# Patient Record
Sex: Male | Born: 1945 | Race: White | Hispanic: No | Marital: Single | Smoking: Current every day smoker
Health system: Southern US, Community
[De-identification: ages and names within clinical notes are randomized; demographics above are authoritative.]

## PROBLEM LIST (undated history)

## (undated) DIAGNOSIS — L989 Disorder of the skin and subcutaneous tissue, unspecified: Secondary | ICD-10-CM

## (undated) DIAGNOSIS — H3530 Unspecified macular degeneration: Secondary | ICD-10-CM

## (undated) DIAGNOSIS — C61 Malignant neoplasm of prostate: Secondary | ICD-10-CM

## (undated) DIAGNOSIS — E785 Hyperlipidemia, unspecified: Secondary | ICD-10-CM

## (undated) DIAGNOSIS — K219 Gastro-esophageal reflux disease without esophagitis: Secondary | ICD-10-CM

## (undated) DIAGNOSIS — I10 Essential (primary) hypertension: Secondary | ICD-10-CM

## (undated) DIAGNOSIS — D126 Benign neoplasm of colon, unspecified: Secondary | ICD-10-CM

## (undated) HISTORY — DX: Hyperlipidemia, unspecified: E78.5

## (undated) HISTORY — DX: Essential (primary) hypertension: I10

## (undated) HISTORY — PX: POLYPECTOMY: SHX149

## (undated) HISTORY — DX: Disorder of the skin and subcutaneous tissue, unspecified: L98.9

## (undated) HISTORY — DX: Gastro-esophageal reflux disease without esophagitis: K21.9

## (undated) HISTORY — PX: NECK LESION BIOPSY: SHX2078

## (undated) HISTORY — DX: Benign neoplasm of colon, unspecified: D12.6

## (undated) HISTORY — PX: COLONOSCOPY: SHX174

---

## 2003-05-18 ENCOUNTER — Encounter (INDEPENDENT_AMBULATORY_CARE_PROVIDER_SITE_OTHER): Payer: Self-pay | Admitting: *Deleted

## 2003-05-18 ENCOUNTER — Ambulatory Visit (HOSPITAL_COMMUNITY): Admission: RE | Admit: 2003-05-18 | Discharge: 2003-05-18 | Payer: Self-pay | Admitting: *Deleted

## 2003-05-18 ENCOUNTER — Encounter (INDEPENDENT_AMBULATORY_CARE_PROVIDER_SITE_OTHER): Payer: Self-pay | Admitting: Specialist

## 2005-03-05 ENCOUNTER — Encounter (INDEPENDENT_AMBULATORY_CARE_PROVIDER_SITE_OTHER): Payer: Self-pay | Admitting: Specialist

## 2005-03-05 ENCOUNTER — Ambulatory Visit (HOSPITAL_COMMUNITY): Admission: RE | Admit: 2005-03-05 | Discharge: 2005-03-05 | Payer: Self-pay | Admitting: *Deleted

## 2005-03-05 ENCOUNTER — Encounter (INDEPENDENT_AMBULATORY_CARE_PROVIDER_SITE_OTHER): Payer: Self-pay | Admitting: *Deleted

## 2008-12-29 ENCOUNTER — Ambulatory Visit: Payer: Self-pay | Admitting: Gastroenterology

## 2008-12-29 DIAGNOSIS — Z860100 Personal history of colon polyps, unspecified: Secondary | ICD-10-CM | POA: Insufficient documentation

## 2008-12-29 DIAGNOSIS — K219 Gastro-esophageal reflux disease without esophagitis: Secondary | ICD-10-CM

## 2009-01-15 DIAGNOSIS — D126 Benign neoplasm of colon, unspecified: Secondary | ICD-10-CM

## 2009-01-15 HISTORY — DX: Benign neoplasm of colon, unspecified: D12.6

## 2009-02-05 ENCOUNTER — Ambulatory Visit: Payer: Self-pay | Admitting: Gastroenterology

## 2009-02-13 ENCOUNTER — Encounter: Payer: Self-pay | Admitting: Gastroenterology

## 2009-11-20 ENCOUNTER — Encounter: Payer: Self-pay | Admitting: Gastroenterology

## 2009-11-29 ENCOUNTER — Encounter (INDEPENDENT_AMBULATORY_CARE_PROVIDER_SITE_OTHER): Payer: Self-pay | Admitting: *Deleted

## 2010-03-19 ENCOUNTER — Encounter (INDEPENDENT_AMBULATORY_CARE_PROVIDER_SITE_OTHER): Payer: Self-pay | Admitting: *Deleted

## 2010-03-28 ENCOUNTER — Encounter (INDEPENDENT_AMBULATORY_CARE_PROVIDER_SITE_OTHER): Payer: Self-pay | Admitting: *Deleted

## 2010-03-29 ENCOUNTER — Ambulatory Visit
Admission: RE | Admit: 2010-03-29 | Discharge: 2010-03-29 | Payer: Self-pay | Source: Home / Self Care | Attending: Gastroenterology | Admitting: Gastroenterology

## 2010-04-01 ENCOUNTER — Telehealth (INDEPENDENT_AMBULATORY_CARE_PROVIDER_SITE_OTHER): Payer: Self-pay | Admitting: *Deleted

## 2010-04-12 ENCOUNTER — Other Ambulatory Visit: Payer: Self-pay | Admitting: Gastroenterology

## 2010-04-12 ENCOUNTER — Ambulatory Visit
Admission: RE | Admit: 2010-04-12 | Discharge: 2010-04-12 | Payer: Self-pay | Source: Home / Self Care | Attending: Gastroenterology | Admitting: Gastroenterology

## 2010-04-12 DIAGNOSIS — D126 Benign neoplasm of colon, unspecified: Secondary | ICD-10-CM

## 2010-04-16 NOTE — Letter (Signed)
Summary: Colonoscopy Letter  [facility]  [address] [place], [place] [address]   Phone: [phone]  Fax: [phone]      November 29, 2009 MRN: [id number]   Dominic Kelly [address] [place], [place]  [address]   Dear [name],   According to your medical record, it is time for you to schedule a Colonoscopy. The American Cancer Society recommends this procedure as a method to detect early colon cancer. Patients with a family history of colon cancer, or a personal history of colon polyps or inflammatory bowel disease are at increased risk.  This letter has beeen generated based on the recommendations made at the time of your procedure. If you feel that in your particular situation this may no longer apply, please contact our office.  Please call our office at [phone] to schedule this appointment or to update your records at your earliest convenience.  Thank you for cooperating with [place] to provide you with the very best care possible.   Sincerely,  Judie Petit T. Russella Dar, M.D.  [facility] [phone]

## 2010-04-16 NOTE — Procedures (Signed)
Summary: Recall Assessment/[facility]  Recall Assessment/[facility]   Imported By: Sherian Rein 12/03/2009 07:32:24  _____________________________________________________________________  External Attachment:    Type:   Image     Comment:   External Document

## 2010-04-18 ENCOUNTER — Encounter: Payer: Self-pay | Admitting: Gastroenterology

## 2010-04-18 NOTE — Miscellaneous (Signed)
Summary: previsit prep/rm  Clinical Lists Changes  Medications: Added new medication of MOVIPREP 100 GM  SOLR (PEG-KCL-NACL-NASULF-NA ASC-C) As per prep instructions. - Signed Rx of MOVIPREP 100 GM  SOLR (PEG-KCL-NACL-NASULF-NA ASC-C) As per prep instructions.;  #1 x 0;  Signed;  Entered by: Sherren Kerns RN;  Authorized by: Meryl Dare MD [facility];  Method used: [facility] to [facility]  [phone]*, [address] [place], [place]  [address], Ph: [phone] or [phone], Fax: [phone] Observations: Added new observation of ALLERGY REV: Done (03/29/2010 9:46) Added new observation of NKA: T (03/29/2010 9:46)    Prescriptions: MOVIPREP 100 GM  SOLR (PEG-KCL-NACL-NASULF-NA ASC-C) As per prep instructions.  #1 x 0   Entered by:   Sherren Kerns RN   Authorized by:   Meryl Dare MD [place]   Signed by:   Sherren Kerns RN on 03/29/2010   Method used:   Electronically to        [facility]  [phone]* (retail)       [address] [place], [place]  [address]       Ph: [phone] or [phone]       Fax: [phone]   RxID:   [id number]

## 2010-04-18 NOTE — Progress Notes (Signed)
Summary: ins wont cover prep  Phone Note Call from Patient Call back at Home Phone [phone]   Caller: Patient Call For: Dr Russella Dar Reason for Call: Talk to Nurse Summary of Call: Insurance wont cover movi prep needs something else called in. Initial call taken by: Tawni Levy,  April 01, 2010 2:16 PM  Follow-up for Phone Call        pt offered rebate for Moviprep and agrees to use Moviprep. Follow-up by: Ezra Sites RN,  April 01, 2010 2:39 PM

## 2010-04-18 NOTE — Letter (Signed)
Summary: [facility] Instructions  [facility]  [address] [place], [place] [address]   Phone: [phone]  Fax: [phone]       Dominic Kelly    10/17/1945    MRN: [id number]        Procedure Day /Date:  Friday 04/12/2010     Arrival Time: 2:00 pm     Procedure Time: 3:00 pm     Location of Procedure:                    _ x_  [facility] (4th Floor)                        PREPARATION FOR COLONOSCOPY WITH MOVIPREP   Starting 5 days prior to your procedure Sunday 1/22 do not eat nuts, seeds, popcorn, corn, beans, peas,  salads, or any raw vegetables.  Do not take any fiber supplements (e.g. Metamucil, Citrucel, and Benefiber).  THE DAY BEFORE YOUR PROCEDURE         DATE: Thursday 1/26  1.  Drink clear liquids the entire day-NO SOLID FOOD  2.  Do not drink anything colored red or purple.  Avoid juices with pulp.  No orange juice.  3.  Drink at least 64 oz. (8 glasses) of fluid/clear liquids during the day to prevent dehydration and help the prep work efficiently.  CLEAR LIQUIDS INCLUDE: Water Jello Ice Popsicles Tea (sugar ok, no milk/cream) Powdered fruit flavored drinks Coffee (sugar ok, no milk/cream) Gatorade Juice: apple, white grape, white cranberry  Lemonade Clear bullion, consomm, broth Carbonated beverages (any kind) Strained chicken noodle soup Hard Candy                             4.  In the morning, mix first dose of MoviPrep solution:    Empty 1 Pouch A and 1 Pouch B into the disposable container    Add lukewarm drinking water to the top line of the container. Mix to dissolve    Refrigerate (mixed solution should be used within 24 hrs)  5.  Begin drinking the prep at 5:00 p.m. The MoviPrep container is divided by 4 marks.   Every 15 minutes drink the solution down to the next mark (approximately 8 oz) until the full liter is complete.   6.  Follow completed prep with 16 oz of clear liquid of your choice (Nothing  red or purple).  Continue to drink clear liquids until bedtime.  7.  Before going to bed, mix second dose of MoviPrep solution:    Empty 1 Pouch A and 1 Pouch B into the disposable container    Add lukewarm drinking water to the top line of the container. Mix to dissolve    Refrigerate  THE DAY OF YOUR PROCEDURE      DATE: Friday 1/27  Beginning at 10:00 a.m. (5 hours before procedure):         1. Every 15 minutes, drink the solution down to the next mark (approx 8 oz) until the full liter is complete.  2. Follow completed prep with 16 oz. of clear liquid of your choice.    3. You may drink clear liquids until 1:00 pm (2 HOURS BEFORE PROCEDURE).   MEDICATION INSTRUCTIONS  Unless otherwise instructed, you should take regular prescription medications with a small sip of water   as early as possible the morning of your  procedure.        OTHER INSTRUCTIONS  You will need a responsible adult at least 67 years of age to accompany you and drive you home.   This person must remain in the waiting room during your procedure.  Wear loose fitting clothing that is easily removed.  Leave jewelry and other valuables at home.  However, you may wish to bring a book to read or  an iPod/MP3 player to listen to music as you wait for your procedure to start.  Remove all body piercing jewelry and leave at home.  Total time from sign-in until discharge is approximately 2-3 hours.  You should go home directly after your procedure and rest.  You can resume normal activities the  day after your procedure.  The day of your procedure you should not:   Drive   Make legal decisions   Operate machinery   Drink alcohol   Return to work  You will receive specific instructions about eating, activities and medications before you leave.    The above instructions have been reviewed and explained to me by   Sherren Kerns RN  March 29, 2010 10:18 AM   I fully understand and can verbalize  these instructions _____________________________ Date _________

## 2010-04-18 NOTE — Letter (Signed)
Summary: Pre Visit Letter Revised  [facility]  [address] [place], [place] [address]   Phone: [phone]  Fax: [phone]        03/19/2010 MRN: [id number] Dominic Kelly [address] [place], [place]  [address]             Procedure Date:  04-12-10   Welcome to the Gastroenterology Division at [facility].    You are scheduled to see a nurse for your pre-procedure visit on 03-29-10 at 10:30a.m. on the 3rd floor at [facility], [address]  We ask that you try to arrive at our office 15 minutes prior to your appointment time to allow for check-in.  Please take a minute to review the attached form.  If you answer "Yes" to one or more of the questions on the first page, we ask that you call the person listed at your earliest opportunity.  If you answer "No" to all of the questions, please complete the rest of the form and bring it to your appointment.    Your nurse visit will consist of discussing your medical and surgical history, your immediate family medical history, and your medications.   If you are unable to list all of your medications on the form, please bring the medication bottles to your appointment and we will list them.  We will need to be aware of both prescribed and over the counter drugs.  We will need to know exact dosage information as well.    Please be prepared to read and sign documents such as consent forms, a financial agreement, and acknowledgement forms.  If necessary, and with your consent, a friend or relative is welcome to sit-in on the nurse visit with you.  Please bring your insurance card so that we may make a copy of it.  If your insurance requires a referral to see a specialist, please bring your referral form from your primary care physician.  No co-pay is required for this nurse visit.     If you cannot keep your appointment, please call [phone] to cancel or reschedule prior to your appointment date.  This  allows [place] the opportunity to schedule an appointment for another patient in need of care.    Thank you for choosing [facility] for your medical needs.  We appreciate the opportunity to care for you.  Please visit [place] at our website  to learn more about our practice.  Sincerely, The Gastroenterology Division

## 2010-04-18 NOTE — Procedures (Addendum)
Summary: Colonoscopy  Patient: Dominic Kelly Note: All result statuses are Final unless otherwise noted.  Tests: (1) Colonoscopy (COL)   COL Colonoscopy           DONE (C)     [facility]     [address]     [place], [place]  [address]           COLONOSCOPY PROCEDURE REPORT           PATIENT:  Dominic Kelly, Dominic Kelly  MR#:  [id number]     BIRTHDATE:  [birth year], 64 yrs. old  GENDER:  male     ENDOSCOPIST:  Judie Petit T. Russella Dar, MD, [facility]           PROCEDURE DATE:  04/12/2010     PROCEDURE:  Colonoscopy with snare polypectomy, and hot biopsy     polypectomy     ASA CLASS:  Class II     INDICATIONS:  1) surveillance and high-risk screening  2) history     of adenomatous colon polyps, 2010, piecemeal polypectomy.     MEDICATIONS:   Fentanyl 75 mcg IV, Versed 7 mg IV     DESCRIPTION OF PROCEDURE:   After the risks benefits and     alternatives of the procedure were thoroughly explained, informed     consent was obtained.  Digital rectal exam was performed and     revealed no abnormalities.   The LB PCF-[id number] endoscope     was introduced through the anus and advanced to the cecum, which     was identified by both the appendix and ileocecal valve, without     limitations.  The quality of the prep was excellent, using     MoviPrep.  The instrument was then slowly withdrawn as the colon     was fully examined.     <<PROCEDUREIMAGES>>     FINDINGS:  A sessile polyp was found in the ascending colon. It     was 10 mm in size. Polyp was snared, then cauterized with     monopolar cautery. Retrieval was successful. Piecemeal polypectomy     that appeared to be complete wtih polyp best seen on retroflexed     view. A sessile polyp was found in the proximal transverse colon.     It was 10 mm in size. With hot biopsy forceps, the polyp was     cauterized in several places biopsies were obtained and sent to     pathology. Piecemeal polypectomy of polyp located behind a fold.     Not  certain the entire polyp was removed/destroyed. Mild     diverticulosis was found in the sigmoid colon. A normal appearing     cecum, ileocecal valve, and appendiceal orifice were identified.     The hepatic flexure, splenic flexure, descending colon, and rectum     appeared unremarkable. Retroflexed views in the rectum revealed     internal hemorrhoids, small. The time to cecum =  2  minutes. The     scope was then withdrawn (time =  18.75  min) from the patient and     the procedure completed.           COMPLICATIONS:  None           ENDOSCOPIC IMPRESSION:     1) 10 mm sessile polyp in the ascending colon     2) 10 mm sessile polyp in the proximal transverse colon     3) Mild  diverticulosis in the sigmoid colon     4) Internal hemorrhoids           RECOMMENDATIONS:     1) Hold aspirin, aspirin products, and anti-inflamatory     medication for 2 weeks.     2) Await pathology results     3) High fiber diet with liberal fluid intake.     4) Repeat Colonoscopy in 1 year if transverse colon polyp     adenomatous, 2 years otherwise pending pathology review           Malcolm T. Russella Dar, MD, [facility]           CC:  Pearson Grippe, MD           n.     REVISED:  04/12/2010 03:22 PM     eSIGNED:   Judie Petit T. Stark at 04/12/2010 03:22 PM           Dominic Kelly, [id number]  Note: An exclamation mark (!) indicates a result that was not dispersed into the flowsheet. Document Creation Date: 04/12/2010 3:22 PM _______________________________________________________________________  (1) Order result status: Final Collection or observation date-time: 04/12/2010 15:12 Requested date-time:  Receipt date-time:  Reported date-time:  Referring Physician:   Ordering Physician: Claudette Head [phone]) Specimen Source:  Source: Launa Grill Order Number: [id number] Lab site:   Appended Document: Colonoscopy     Procedures Next Due Date:    Colonoscopy: 03/2011

## 2010-04-24 NOTE — Letter (Addendum)
Summary: Patient Notice- Polyp Results  [facility]  [address] [place], [place] [address]   Phone: [phone]  Fax: [phone]        April 18, 2010 MRN: [id number]    Dominic Kelly [address] [place], [place]  [address]    Dear Mr. Cupps,  I am pleased to inform you that the colon polyp(s) removed during your recent colonoscopy was (were) found to be benign (no cancer detected) upon pathologic examination.  I recommend you have a repeat colonoscopy examination in 1 year to look for recurrent polyps, as having colon polyps increases your risk for having recurrent polyps or even colon cancer in the future.  Should you develop new or worsening symptoms of abdominal pain, bowel habit changes or bleeding from the rectum or bowels, please schedule an evaluation with either your primary care physician or with me.  Continue treatment plan as outlined the day of your exam.  Please call us if you are having persistent problems or have questions about your condition that have not been fully answered at this time.  Sincerely,  Meryl Dare MD [facility]  This letter has been electronically signed by your physician.  Appended Document: Patient Notice- Polyp Results LETTER MAILED

## 2010-08-02 NOTE — Op Note (Signed)
NAMEDAUNE, COLGATE NO.:  [id number]   MEDICAL RECORD NO.:  [id number]          PATIENT TYPE:  AMB   LOCATION:  ENDO                         FACILITY[facility]  [facility]   PHYSICIAN:  Georgiana Spinner, M.D.    DATE OF BIRTH:  Jun 04, 1945   DATE OF PROCEDURE:  03/05/2005  DATE OF DISCHARGE:                                 OPERATIVE REPORT   PROCEDURE:  Upper endoscopy.   INDICATIONS:  Rule out Barrett's esophagus.   ANESTHESIA:  Demerol 50, Versed 5 mg.   DESCRIPTION OF PROCEDURE:  With the patient mildly sedated in the left  lateral decubitus position,  the Olympus videoscopic endoscope was inserted  in the mouth, passed under direct vision through the esophagus which  appeared normal. I could not see any clear-cut Barrett's at this point but  we photographed and biopsied areas that could be potentially Barrett's.  Subsequently we advanced into the stomach. The fundus, body, antrum,  duodenal bulb, and second portion of duodenum were visualized. From this  point, the endoscope was slowly withdrawn taking circumferential views of  the duodenal mucosa until the endoscope had been pulled back into the  stomach, placed in retroflexion to view the stomach from below. The  endoscope was straightened and withdrawn taking circumferential views of the  remaining gastric and esophageal mucosa. The patient's vital signs, and  pulse oximeter remained stable. The patient tolerated the procedure well  without apparent complications.   FINDINGS:  Await biopsy report. The patient will call me for results and  follow-up with me as an outpatient.           ______________________________  Georgiana Spinner, M.D.     GMO/MEDQ  D:  03/05/2005  T:  03/06/2005  Job:  [id number]

## 2010-08-02 NOTE — Op Note (Signed)
NAME:  [name], Dominic Kelly                       ACCOUNT NO.:  [id number]   MEDICAL RECORD NO.:  [id number]                   PATIENT TYPE:  AMB   LOCATION:  ENDO                                 FACILITY:  [facility]   PHYSICIAN:  Georgiana Spinner, M.D.                 DATE OF BIRTH:  09/30/1945   DATE OF PROCEDURE:  05/18/2003  DATE OF DISCHARGE:                                 OPERATIVE REPORT   PROCEDURE PERFORMED:  Colonoscopy.   ENDOSCOPIST:  Georgiana Spinner, M.D.   INDICATIONS FOR PROCEDURE:  Colon cancer screening.   ANESTHESIA:  Demerol 10 mg.  Versed 1 mg.   DESCRIPTION OF PROCEDURE:  With the patient mildly sedated in the left  lateral decubitus position, the Olympus video colonoscope was inserted in  the rectum and passed under direct vision to the cecum, identified by the  ileocecal valve and appendiceal orifice, both of which were photographed.  From this point the colonoscope was slowly withdrawn taking circumferential  views of the entire colonic mucosa stopping only in the rectum which  appeared normal on direct and retroflex view.  The endoscope was  straightened and withdrawn.  The patient's vital signs and pulse oximeter  remained stable.  The patient tolerated the procedure well without apparent  complications.   FINDINGS:  Unremarkable examination.   PLAN:  See endoscopy note for further details.                                               Georgiana Spinner, M.D.    GMO/MEDQ  D:  05/18/2003  T:  05/18/2003  Job:  [id number]

## 2010-08-02 NOTE — Op Note (Signed)
NAME:  Dominic Kelly, Dominic Kelly                       ACCOUNT NO.:  [id number]   MEDICAL RECORD NO.:  [id number]                   PATIENT TYPE:  AMB   LOCATION:  ENDO                                 FACILITY:  [facility]   PHYSICIAN:  Georgiana Spinner, M.D.                 DATE OF BIRTH:  25-Dec-1945   DATE OF PROCEDURE:  05/18/2003  DATE OF DISCHARGE:                                 OPERATIVE REPORT   PROCEDURE PERFORMED:  Upper endoscopy.   ENDOSCOPIST:  Georgiana Spinner, M.D.   INDICATIONS FOR PROCEDURE:  Gastroesophageal reflux disease.   ANESTHESIA:  Demerol 40 mg, Versed 4 mg.   DESCRIPTION OF PROCEDURE:  With the patient mildly sedated in the left  lateral decubitus position, the Olympus video endoscope was inserted in the  mouth and passed under direct vision through the esophagus which appeared  normal until we reached the distal esophagus and there was a question of  short segment Barrett's photographed and biopsied.  We entered into the  stomach.  The fundus, body, antrum were visualized and in the antrum were  diffuse punctate erythematous changes consistent with small multiple  erosions which were photographed and biopsied.  The duodenal bulb appeared  to show some mild erythema.  Second portion of the duodenum, however, was  normal.  From this point, the endoscope was slowly withdrawn taking  circumferential views of the duodenal mucosa until the endoscope was pulled  back into the stomach and placed on retroflexion to view the stomach from  below.  The endoscope was then straightened and withdrawn taking  circumferential views of the remaining gastric and esophageal mucosa.  The  patient's vital signs and pulse oximeter remained stable.  The patient  tolerated the procedure well without apparent complications.   FINDINGS:  Question of Barrett's esophagus biopsied, antral erosions  biopsied.  Await biopsy reports.  Patient will call me for results and  follow up with me as an  outpatient and proceed to colonoscopy.   PLAN:                                               Georgiana Spinner, M.D.    GMO/MEDQ  D:  05/18/2003  T:  05/18/2003  Job:  [id number]

## 2010-08-19 ENCOUNTER — Other Ambulatory Visit: Payer: Self-pay | Admitting: Ophthalmology

## 2011-04-23 ENCOUNTER — Encounter: Payer: Self-pay | Admitting: Gastroenterology

## 2011-05-07 DIAGNOSIS — L570 Actinic keratosis: Secondary | ICD-10-CM | POA: Diagnosis not present

## 2011-05-07 DIAGNOSIS — D235 Other benign neoplasm of skin of trunk: Secondary | ICD-10-CM | POA: Diagnosis not present

## 2011-05-19 ENCOUNTER — Encounter: Payer: Self-pay | Admitting: Gastroenterology

## 2011-05-19 ENCOUNTER — Ambulatory Visit (INDEPENDENT_AMBULATORY_CARE_PROVIDER_SITE_OTHER): Payer: Medicare Other | Admitting: Gastroenterology

## 2011-05-19 VITALS — BP 140/90 | HR 68 | Ht 70.0 in | Wt 207.2 lb

## 2011-05-19 DIAGNOSIS — Z86010 Personal history of colonic polyps: Secondary | ICD-10-CM

## 2011-05-19 DIAGNOSIS — K219 Gastro-esophageal reflux disease without esophagitis: Secondary | ICD-10-CM | POA: Diagnosis not present

## 2011-05-19 MED ORDER — PEG-KCL-NACL-NASULF-NA ASC-C 100 G PO SOLR: 1.0000 | Freq: Once | ORAL | Status: DC

## 2011-05-19 NOTE — Patient Instructions (Addendum)
You have been given a separate informational sheet regarding your tobacco use, the importance of quitting and local resources to help you quit.  You have been scheduled for a colonoscopy with propofol. Please follow written instructions given to you at your visit today.  Please pick up your prep kit at the pharmacy within the next 1-3 days.  cc: Pearson Grippe, MD

## 2011-05-19 NOTE — Progress Notes (Signed)
History of Present Illness: This is a 66 year old male with a history of adenomatous colon polyps. He underwent piecemeal polypectomy of polyp located behind a fold in January 2012. It was unclear if the polyp was completely removed. Prior to that he had piecemeal polypectomy in 2010. He has no ongoing gastrointestinal complaints. He notes a recent cough. Denies weight loss, abdominal pain, constipation, diarrhea, change in stool caliber, melena, hematochezia, nausea, vomiting, dysphagia, reflux symptoms, chest pain.  Current Medications, Allergies, Past Medical History, Past Surgical History, Family History and Social History were reviewed in [employer] record.  Physical Exam: General: Well developed , well nourished, no acute distress Head: Normocephalic and atraumatic Eyes:  sclerae anicteric, EOMI Ears: Normal auditory acuity Mouth: No deformity or lesions Lungs: Scattered wheezes bilaterally Heart: Regular rate and rhythm; no murmurs, rubs or bruits Abdomen: Soft, non tender and non distended. No masses, hepatosplenomegaly or hernias noted. Normal Bowel sounds Musculoskeletal: Symmetrical with no gross deformities  Pulses:  Normal pulses noted Extremities: No clubbing, cyanosis, edema or deformities noted Neurological: Alert oriented x 4, grossly nonfocal Psychological:  Alert and cooperative. Normal mood and affect  Assessment and Recommendations:  1. Personal history of adenomatous colon polyps with piecemeal polypectomy performed in January 2012. For followup colonoscopy to determine the polyp was completely removed and to survey for other colon polyps. The risks, benefits, and alternatives to colonoscopy with possible biopsy and possible polypectomy were discussed with the patient and they consent to proceed.   2. Recent cough scattered wheezes on physical examination. Advised him to schedule an office evaluation with Dr. Selena Batten.   3. GERD. Currently diet  controlled.

## 2011-07-09 ENCOUNTER — Encounter: Payer: Self-pay | Admitting: Gastroenterology

## 2011-07-09 ENCOUNTER — Ambulatory Visit (AMBULATORY_SURGERY_CENTER): Payer: Medicare Other | Admitting: Gastroenterology

## 2011-07-09 VITALS — BP 148/79 | HR 66 | Temp 96.4°F | Resp 18 | Ht 70.0 in | Wt 207.0 lb

## 2011-07-09 DIAGNOSIS — Z1211 Encounter for screening for malignant neoplasm of colon: Secondary | ICD-10-CM

## 2011-07-09 DIAGNOSIS — I10 Essential (primary) hypertension: Secondary | ICD-10-CM | POA: Diagnosis not present

## 2011-07-09 DIAGNOSIS — Z86010 Personal history of colonic polyps: Secondary | ICD-10-CM | POA: Diagnosis not present

## 2011-07-09 DIAGNOSIS — K635 Polyp of colon: Secondary | ICD-10-CM

## 2011-07-09 DIAGNOSIS — D126 Benign neoplasm of colon, unspecified: Secondary | ICD-10-CM

## 2011-07-09 DIAGNOSIS — K219 Gastro-esophageal reflux disease without esophagitis: Secondary | ICD-10-CM | POA: Diagnosis not present

## 2011-07-09 MED ORDER — SODIUM CHLORIDE 0.9 % IV SOLN: 500.0000 mL | INTRAVENOUS | Status: DC

## 2011-07-09 NOTE — Progress Notes (Signed)
Patient did not experience any of the following events: a burn prior to discharge; a fall within the facility; wrong site/side/patient/procedure/implant event; or a hospital transfer or hospital admission upon discharge from the facility. (G8907) Patient did not have preoperative order for IV antibiotic SSI prophylaxis. (G8918)  

## 2011-07-09 NOTE — Op Note (Signed)
[facility] [address] [place], [place]  [address]  COLONOSCOPY PROCEDURE REPORT PATIENT:  Dominic Kelly, Dominic Kelly  MR#:  [id number] BIRTHDATE:  April 25, 1945, 65 yrs. old  GENDER:  male ENDOSCOPIST:  Judie Petit T. Russella Dar, MD, [facility]  PROCEDURE DATE:  07/09/2011 PROCEDURE:  Colonoscopy with biopsy ASA CLASS:  Class II INDICATIONS:  1) surveillance and high-risk screening  2) history of pre-cancerous (adenomatous) colon polyps MEDICATIONS:   MAC sedation, administered by CRNA, propofol (Diprivan) 200 mg IV DESCRIPTION OF PROCEDURE:   After the risks benefits and alternatives of the procedure were thoroughly explained, informed consent was obtained.  Digital rectal exam was performed and revealed no abnormalities.   The LB CF-H180AL [id number] endoscope was introduced through the anus and advanced to the cecum, which was identified by both the appendix and ileocecal valve, without limitations.  The quality of the prep was good, using MoviPrep. The instrument was then slowly withdrawn as the colon was fully examined. <<PROCEDUREIMAGES>> FINDINGS:  A sessile polyp was found at the hepatic flexure. It was 4 mm in size. The polyp was removed using cold biopsy forceps. Mild diverticulosis was found in the sigmoid colon.  Otherwise normal colonoscopy without other polyps, masses, vascular ectasias, or inflammatory changes. Retroflexed views in the rectum revealed internal hemorrhoids, small.  The time to cecum =  3.25 minutes. The scope was then withdrawn (time =  8.5  min) from the patient and the procedure completed.  COMPLICATIONS:  None  ENDOSCOPIC IMPRESSION: 1) 4 mm sessile polyp at the hepatic flexure 2) Mild diverticulosis in the sigmoid colon 3) Internal hemorrhoids  RECOMMENDATIONS: 1) Await pathology results 2) High fiber diet with liberal fluid intake. 3) Repeat Colonoscopy in 3 years.  Venita Lick. Russella Dar, MD, [facility]  CC:  Pearson Grippe, MD  n. Rosalie DoctorVenita Lick. Saleem Coccia at  07/09/2011 08:55 AM  Sinclair Ship, [id number]

## 2011-07-09 NOTE — Patient Instructions (Signed)
Discharge instructions given with verbal understanding. Handouts on polyps,diverticulosis, and hemorrhoids given. Resume previous medications.YOU HAD AN ENDOSCOPIC PROCEDURE TODAY AT THE [facility]: Refer to the procedure report that was given to you for any specific questions about what was found during the examination.  If the procedure report does not answer your questions, please call your gastroenterologist to clarify.  If you requested that your care partner not be given the details of your procedure findings, then the procedure report has been included in a sealed envelope for you to review at your convenience later.  YOU SHOULD EXPECT: Some feelings of bloating in the abdomen. Passage of more gas than usual.  Walking can help get rid of the air that was put into your GI tract during the procedure and reduce the bloating. If you had a lower endoscopy (such as a colonoscopy or flexible sigmoidoscopy) you may notice spotting of blood in your stool or on the toilet paper. If you underwent a bowel prep for your procedure, then you may not have a normal bowel movement for a few days.  DIET: Your first meal following the procedure should be a light meal and then it is ok to progress to your normal diet.  A half-sandwich or bowl of soup is an example of a good first meal.  Heavy or fried foods are harder to digest and may make you feel nauseous or bloated.  Likewise meals heavy in dairy and vegetables can cause extra gas to form and this can also increase the bloating.  Drink plenty of fluids but you should avoid alcoholic beverages for 24 hours.  ACTIVITY: Your care partner should take you home directly after the procedure.  You should plan to take it easy, moving slowly for the rest of the day.  You can resume normal activity the day after the procedure however you should NOT DRIVE or use heavy machinery for 24 hours (because of the sedation medicines used during the test).    SYMPTOMS TO  REPORT IMMEDIATELY: A gastroenterologist can be reached at any hour.  During normal business hours, 8:30 AM to 5:00 PM Monday through Friday, call [phone].  After hours and on weekends, please call the GI answering service at [phone] who will take a message and have the physician on call contact you.   Following lower endoscopy (colonoscopy or flexible sigmoidoscopy):  Excessive amounts of blood in the stool  Significant tenderness or worsening of abdominal pains  Swelling of the abdomen that is new, acute  Fever of 100F or higher FOLLOW UP: If any biopsies were taken you will be contacted by phone or by letter within the next 1-3 weeks.  Call your gastroenterologist if you have not heard about the biopsies in 3 weeks.  Our staff will call the home number listed on your records the next business day following your procedure to check on you and address any questions or concerns that you may have at that time regarding the information given to you following your procedure. This is a courtesy call and so if there is no answer at the home number and we have not heard from you through the emergency physician on call, we will assume that you have returned to your regular daily activities without incident.  SIGNATURES/CONFIDENTIALITY: You and/or your care partner have signed paperwork which will be entered into your electronic medical record.  These signatures attest to the fact that that the information above on your After Visit Summary has  been reviewed and is understood.  Full responsibility of the confidentiality of this discharge information lies with you and/or your care-partner.  

## 2011-07-10 ENCOUNTER — Telehealth: Payer: Self-pay

## 2011-07-10 NOTE — Telephone Encounter (Signed)
  Follow up Call-  Call back number 07/09/2011  Post procedure Call Back phone  # [phone]  Permission to leave phone message Yes     Patient questions:  Do you have a fever, pain , or abdominal swelling? no Pain Score  0 *  Have you tolerated food without any problems? yes  Have you been able to return to your normal activities? yes  Do you have any questions about your discharge instructions: Diet   no Medications  no Follow up visit  no  Do you have questions or concerns about your Care? no  Actions: * If pain score is 4 or above: No action needed, pain <4.   Per the pt "no problems", maw

## 2011-07-14 ENCOUNTER — Encounter: Payer: Self-pay | Admitting: Gastroenterology

## 2011-07-24 DIAGNOSIS — H40019 Open angle with borderline findings, low risk, unspecified eye: Secondary | ICD-10-CM | POA: Diagnosis not present

## 2011-07-24 DIAGNOSIS — H52209 Unspecified astigmatism, unspecified eye: Secondary | ICD-10-CM | POA: Diagnosis not present

## 2011-07-24 DIAGNOSIS — H2510 Age-related nuclear cataract, unspecified eye: Secondary | ICD-10-CM | POA: Diagnosis not present

## 2011-07-24 DIAGNOSIS — H524 Presbyopia: Secondary | ICD-10-CM | POA: Diagnosis not present

## 2011-08-06 DIAGNOSIS — L570 Actinic keratosis: Secondary | ICD-10-CM | POA: Diagnosis not present

## 2011-08-06 DIAGNOSIS — L259 Unspecified contact dermatitis, unspecified cause: Secondary | ICD-10-CM | POA: Diagnosis not present

## 2011-09-04 DIAGNOSIS — L820 Inflamed seborrheic keratosis: Secondary | ICD-10-CM | POA: Diagnosis not present

## 2011-09-04 DIAGNOSIS — L259 Unspecified contact dermatitis, unspecified cause: Secondary | ICD-10-CM | POA: Diagnosis not present

## 2011-09-09 DIAGNOSIS — Z125 Encounter for screening for malignant neoplasm of prostate: Secondary | ICD-10-CM | POA: Diagnosis not present

## 2011-09-09 DIAGNOSIS — E7800 Pure hypercholesterolemia, unspecified: Secondary | ICD-10-CM | POA: Diagnosis not present

## 2011-09-09 DIAGNOSIS — R7309 Other abnormal glucose: Secondary | ICD-10-CM | POA: Diagnosis not present

## 2011-09-12 DIAGNOSIS — I10 Essential (primary) hypertension: Secondary | ICD-10-CM | POA: Diagnosis not present

## 2011-09-12 DIAGNOSIS — E875 Hyperkalemia: Secondary | ICD-10-CM | POA: Diagnosis not present

## 2012-02-23 DIAGNOSIS — I10 Essential (primary) hypertension: Secondary | ICD-10-CM | POA: Diagnosis not present

## 2012-02-26 DIAGNOSIS — Z0000 Encounter for general adult medical examination without abnormal findings: Secondary | ICD-10-CM | POA: Diagnosis not present

## 2012-02-26 DIAGNOSIS — R7309 Other abnormal glucose: Secondary | ICD-10-CM | POA: Diagnosis not present

## 2012-02-26 DIAGNOSIS — I10 Essential (primary) hypertension: Secondary | ICD-10-CM | POA: Diagnosis not present

## 2012-02-26 DIAGNOSIS — Z23 Encounter for immunization: Secondary | ICD-10-CM | POA: Diagnosis not present

## 2012-02-26 DIAGNOSIS — E7800 Pure hypercholesterolemia, unspecified: Secondary | ICD-10-CM | POA: Diagnosis not present

## 2012-02-26 DIAGNOSIS — E875 Hyperkalemia: Secondary | ICD-10-CM | POA: Diagnosis not present

## 2012-02-27 DIAGNOSIS — L570 Actinic keratosis: Secondary | ICD-10-CM | POA: Diagnosis not present

## 2012-02-27 DIAGNOSIS — L259 Unspecified contact dermatitis, unspecified cause: Secondary | ICD-10-CM | POA: Diagnosis not present

## 2012-07-29 DIAGNOSIS — H524 Presbyopia: Secondary | ICD-10-CM | POA: Diagnosis not present

## 2012-07-29 DIAGNOSIS — H52209 Unspecified astigmatism, unspecified eye: Secondary | ICD-10-CM | POA: Diagnosis not present

## 2012-07-29 DIAGNOSIS — H2510 Age-related nuclear cataract, unspecified eye: Secondary | ICD-10-CM | POA: Diagnosis not present

## 2012-08-23 DIAGNOSIS — R7309 Other abnormal glucose: Secondary | ICD-10-CM | POA: Diagnosis not present

## 2012-08-23 DIAGNOSIS — I10 Essential (primary) hypertension: Secondary | ICD-10-CM | POA: Diagnosis not present

## 2012-08-23 DIAGNOSIS — E7800 Pure hypercholesterolemia, unspecified: Secondary | ICD-10-CM | POA: Diagnosis not present

## 2012-08-26 DIAGNOSIS — R21 Rash and other nonspecific skin eruption: Secondary | ICD-10-CM | POA: Diagnosis not present

## 2012-08-26 DIAGNOSIS — E7800 Pure hypercholesterolemia, unspecified: Secondary | ICD-10-CM | POA: Diagnosis not present

## 2012-08-26 DIAGNOSIS — I10 Essential (primary) hypertension: Secondary | ICD-10-CM | POA: Diagnosis not present

## 2012-08-26 DIAGNOSIS — R7309 Other abnormal glucose: Secondary | ICD-10-CM | POA: Diagnosis not present

## 2012-09-13 DIAGNOSIS — L259 Unspecified contact dermatitis, unspecified cause: Secondary | ICD-10-CM | POA: Diagnosis not present

## 2012-09-13 DIAGNOSIS — L821 Other seborrheic keratosis: Secondary | ICD-10-CM | POA: Diagnosis not present

## 2012-09-13 DIAGNOSIS — B079 Viral wart, unspecified: Secondary | ICD-10-CM | POA: Diagnosis not present

## 2012-09-13 DIAGNOSIS — L570 Actinic keratosis: Secondary | ICD-10-CM | POA: Diagnosis not present

## 2013-02-21 DIAGNOSIS — R7309 Other abnormal glucose: Secondary | ICD-10-CM | POA: Diagnosis not present

## 2013-02-21 DIAGNOSIS — I10 Essential (primary) hypertension: Secondary | ICD-10-CM | POA: Diagnosis not present

## 2013-02-21 DIAGNOSIS — Z125 Encounter for screening for malignant neoplasm of prostate: Secondary | ICD-10-CM | POA: Diagnosis not present

## 2013-02-24 DIAGNOSIS — Z23 Encounter for immunization: Secondary | ICD-10-CM | POA: Diagnosis not present

## 2013-02-24 DIAGNOSIS — I10 Essential (primary) hypertension: Secondary | ICD-10-CM | POA: Diagnosis not present

## 2013-02-24 DIAGNOSIS — R7309 Other abnormal glucose: Secondary | ICD-10-CM | POA: Diagnosis not present

## 2013-02-24 DIAGNOSIS — N400 Enlarged prostate without lower urinary tract symptoms: Secondary | ICD-10-CM | POA: Diagnosis not present

## 2013-02-24 DIAGNOSIS — E7800 Pure hypercholesterolemia, unspecified: Secondary | ICD-10-CM | POA: Diagnosis not present

## 2013-02-28 DIAGNOSIS — B079 Viral wart, unspecified: Secondary | ICD-10-CM | POA: Diagnosis not present

## 2013-02-28 DIAGNOSIS — L570 Actinic keratosis: Secondary | ICD-10-CM | POA: Diagnosis not present

## 2013-02-28 DIAGNOSIS — L259 Unspecified contact dermatitis, unspecified cause: Secondary | ICD-10-CM | POA: Diagnosis not present

## 2013-02-28 DIAGNOSIS — D235 Other benign neoplasm of skin of trunk: Secondary | ICD-10-CM | POA: Diagnosis not present

## 2013-08-04 DIAGNOSIS — H52209 Unspecified astigmatism, unspecified eye: Secondary | ICD-10-CM | POA: Diagnosis not present

## 2013-08-04 DIAGNOSIS — H2510 Age-related nuclear cataract, unspecified eye: Secondary | ICD-10-CM | POA: Diagnosis not present

## 2013-08-22 DIAGNOSIS — R7309 Other abnormal glucose: Secondary | ICD-10-CM | POA: Diagnosis not present

## 2013-08-22 DIAGNOSIS — I10 Essential (primary) hypertension: Secondary | ICD-10-CM | POA: Diagnosis not present

## 2013-08-22 DIAGNOSIS — N400 Enlarged prostate without lower urinary tract symptoms: Secondary | ICD-10-CM | POA: Diagnosis not present

## 2013-08-25 DIAGNOSIS — Z125 Encounter for screening for malignant neoplasm of prostate: Secondary | ICD-10-CM | POA: Diagnosis not present

## 2013-08-25 DIAGNOSIS — R7309 Other abnormal glucose: Secondary | ICD-10-CM | POA: Diagnosis not present

## 2013-08-25 DIAGNOSIS — I10 Essential (primary) hypertension: Secondary | ICD-10-CM | POA: Diagnosis not present

## 2013-08-25 DIAGNOSIS — E7800 Pure hypercholesterolemia, unspecified: Secondary | ICD-10-CM | POA: Diagnosis not present

## 2013-08-29 DIAGNOSIS — L821 Other seborrheic keratosis: Secondary | ICD-10-CM | POA: Diagnosis not present

## 2013-08-29 DIAGNOSIS — D235 Other benign neoplasm of skin of trunk: Secondary | ICD-10-CM | POA: Diagnosis not present

## 2013-08-29 DIAGNOSIS — D485 Neoplasm of uncertain behavior of skin: Secondary | ICD-10-CM | POA: Diagnosis not present

## 2013-08-29 DIAGNOSIS — L570 Actinic keratosis: Secondary | ICD-10-CM | POA: Diagnosis not present

## 2014-02-22 DIAGNOSIS — I10 Essential (primary) hypertension: Secondary | ICD-10-CM | POA: Diagnosis not present

## 2014-02-22 DIAGNOSIS — R739 Hyperglycemia, unspecified: Secondary | ICD-10-CM | POA: Diagnosis not present

## 2014-02-22 DIAGNOSIS — Z125 Encounter for screening for malignant neoplasm of prostate: Secondary | ICD-10-CM | POA: Diagnosis not present

## 2014-02-28 DIAGNOSIS — D225 Melanocytic nevi of trunk: Secondary | ICD-10-CM | POA: Diagnosis not present

## 2014-02-28 DIAGNOSIS — L814 Other melanin hyperpigmentation: Secondary | ICD-10-CM | POA: Diagnosis not present

## 2014-02-28 DIAGNOSIS — L821 Other seborrheic keratosis: Secondary | ICD-10-CM | POA: Diagnosis not present

## 2014-02-28 DIAGNOSIS — L570 Actinic keratosis: Secondary | ICD-10-CM | POA: Diagnosis not present

## 2014-03-01 DIAGNOSIS — Z23 Encounter for immunization: Secondary | ICD-10-CM | POA: Diagnosis not present

## 2014-03-01 DIAGNOSIS — Z125 Encounter for screening for malignant neoplasm of prostate: Secondary | ICD-10-CM | POA: Diagnosis not present

## 2014-03-01 DIAGNOSIS — I10 Essential (primary) hypertension: Secondary | ICD-10-CM | POA: Diagnosis not present

## 2014-03-01 DIAGNOSIS — R739 Hyperglycemia, unspecified: Secondary | ICD-10-CM | POA: Diagnosis not present

## 2014-03-01 DIAGNOSIS — E780 Pure hypercholesterolemia: Secondary | ICD-10-CM | POA: Diagnosis not present

## 2014-05-16 DIAGNOSIS — L570 Actinic keratosis: Secondary | ICD-10-CM | POA: Diagnosis not present

## 2014-05-16 DIAGNOSIS — L309 Dermatitis, unspecified: Secondary | ICD-10-CM | POA: Diagnosis not present

## 2014-06-01 ENCOUNTER — Encounter: Payer: Self-pay | Admitting: Gastroenterology

## 2014-08-28 DIAGNOSIS — Z125 Encounter for screening for malignant neoplasm of prostate: Secondary | ICD-10-CM | POA: Diagnosis not present

## 2014-08-28 DIAGNOSIS — I10 Essential (primary) hypertension: Secondary | ICD-10-CM | POA: Diagnosis not present

## 2014-09-01 DIAGNOSIS — R7309 Other abnormal glucose: Secondary | ICD-10-CM | POA: Diagnosis not present

## 2014-09-01 DIAGNOSIS — E119 Type 2 diabetes mellitus without complications: Secondary | ICD-10-CM | POA: Diagnosis not present

## 2014-09-01 DIAGNOSIS — I10 Essential (primary) hypertension: Secondary | ICD-10-CM | POA: Diagnosis not present

## 2014-09-01 DIAGNOSIS — E780 Pure hypercholesterolemia: Secondary | ICD-10-CM | POA: Diagnosis not present

## 2014-09-29 DIAGNOSIS — H2513 Age-related nuclear cataract, bilateral: Secondary | ICD-10-CM | POA: Diagnosis not present

## 2014-11-21 DIAGNOSIS — L309 Dermatitis, unspecified: Secondary | ICD-10-CM | POA: Diagnosis not present

## 2014-11-21 DIAGNOSIS — L570 Actinic keratosis: Secondary | ICD-10-CM | POA: Diagnosis not present

## 2014-12-05 ENCOUNTER — Encounter: Payer: Self-pay | Admitting: Gastroenterology

## 2014-12-12 ENCOUNTER — Encounter: Payer: Self-pay | Admitting: Gastroenterology

## 2015-01-18 ENCOUNTER — Ambulatory Visit (AMBULATORY_SURGERY_CENTER): Payer: Self-pay | Admitting: *Deleted

## 2015-01-18 VITALS — Ht 70.0 in | Wt 207.0 lb

## 2015-01-18 DIAGNOSIS — Z86010 Personal history of colonic polyps: Secondary | ICD-10-CM

## 2015-01-18 NOTE — Progress Notes (Signed)
No egg or soy allergy No issues with past sedation No diet pills No home 02 use   emmi video  Pt has medicare A and B and supp of blue medicare that will not cover suprep. We are out of samples 4th and [place] states we should be getting samples early next week and if pt cam, he can come back ans get a sample once we have them. Pt wishes to do that. Instructed we will call him once we have samples, and he can pick it up 4th floor desk. Lelan Pons PV

## 2015-01-19 ENCOUNTER — Telehealth: Payer: Self-pay | Admitting: *Deleted

## 2015-01-19 NOTE — Telephone Encounter (Signed)
Called pt to let him know we found a suprep sample for him, pt states he will pick it up today 01/19/15, left sample at desk on 4th floor-adm

## 2015-01-29 ENCOUNTER — Encounter: Payer: Self-pay | Admitting: Gastroenterology

## 2015-01-29 ENCOUNTER — Ambulatory Visit (AMBULATORY_SURGERY_CENTER): Payer: Medicare Other | Admitting: Gastroenterology

## 2015-01-29 VITALS — BP 127/65 | HR 70 | Temp 96.0°F | Resp 26 | Ht 70.0 in | Wt 207.0 lb

## 2015-01-29 DIAGNOSIS — D122 Benign neoplasm of ascending colon: Secondary | ICD-10-CM

## 2015-01-29 DIAGNOSIS — D123 Benign neoplasm of transverse colon: Secondary | ICD-10-CM | POA: Diagnosis not present

## 2015-01-29 DIAGNOSIS — E669 Obesity, unspecified: Secondary | ICD-10-CM | POA: Diagnosis not present

## 2015-01-29 DIAGNOSIS — D125 Benign neoplasm of sigmoid colon: Secondary | ICD-10-CM

## 2015-01-29 DIAGNOSIS — N400 Enlarged prostate without lower urinary tract symptoms: Secondary | ICD-10-CM | POA: Diagnosis not present

## 2015-01-29 DIAGNOSIS — Z86010 Personal history of colonic polyps: Secondary | ICD-10-CM

## 2015-01-29 DIAGNOSIS — I10 Essential (primary) hypertension: Secondary | ICD-10-CM | POA: Diagnosis not present

## 2015-01-29 MED ORDER — SODIUM CHLORIDE 0.9 % IV SOLN: 500.0000 mL | INTRAVENOUS | Status: DC

## 2015-01-29 NOTE — Progress Notes (Signed)
Report to PACU, RN, vss, BBS= Clear.  

## 2015-01-29 NOTE — Op Note (Signed)
[facility]  [address]. [place], [id number]   COLONOSCOPY PROCEDURE REPORT  PATIENT: Dominic Kelly, Dominic Kelly  MR#: [id number] BIRTHDATE: Mar 29, 1945 , 51  yrs. old GENDER: male ENDOSCOPIST: Ladene Artist, MD, [facility] REFERRED [id number] [name], M.D. PROCEDURE DATE:  01/29/2015 PROCEDURE:   Colonoscopy, surveillance , Colonoscopy with biopsy, and Colonoscopy with snare polypectomy First Screening Colonoscopy - Avg.  risk and is 50 yrs.  old or older - No.  Prior Negative Screening - Now for repeat screening. N/A  History of Adenoma - Now for follow-up colonoscopy & has been > or = to 3 yrs.  Yes hx of adenoma.  Has been 3 or more years since last colonoscopy.  Polyps removed today? Yes ASA CLASS:   Class II INDICATIONS:Surveillance due to prior colonic neoplasia and PH Colon Adenoma. MEDICATIONS: Monitored anesthesia care and Propofol 180 mg IV DESCRIPTION OF PROCEDURE:   After the risks benefits and alternatives of the procedure were thoroughly explained, informed consent was obtained.  The digital rectal exam revealed no abnormalities of the rectum.   The LB PFC-H190 [id number]  endoscope was introduced through the anus and advanced to the cecum, which was identified by both the appendix and ileocecal valve. No adverse events experienced.   The quality of the prep was good.  (Suprep was used)  The instrument was then slowly withdrawn as the colon was fully examined. Estimated blood loss is zero unless otherwise noted in this procedure report.    COLON FINDINGS: Four sessile polyps measuring 5-7 mm in size were found in the descending colon and transverse colon.  Polypectomies were performed with a cold snare.  The resection was complete, the polyp tissue was partially retrieved and sent to histology. The smaller transverse colon polyps was not retrieved. Two sessile polyps measuring 4 mm in size were found in the ascending colon.  A polypectomy was performed.   Polypectomies were performed with cold forceps.  The resection was complete, the polyp tissue was completely retrieved and sent to histology.   There was mild diverticulosis noted in the sigmoid colon.   The examination was otherwise normal.  Retroflexed views revealed internal Grade I hemorrhoids. The time to cecum = 3.3 Withdrawal time = 14.4   The scope was withdrawn and the procedure completed.   COMPLICATIONS: There were no immediate complications.  ENDOSCOPIC IMPRESSION: 1.   Four sessile polyps in the descending colon and transverse colon; polypectomies performed with a cold snare 2.   Two sessile polyps were found in the ascending colon; polypectomies performed with cold forceps 3.   Mild diverticulosis in the sigmoid colon 4.   Grade l internal hemorrhoids  RECOMMENDATIONS: 1.  Await pathology results 2.  High fiber diet with liberal fluid intake. 3.  Repeat colonoscopy in 3 years if 3 or more polyps adenomatous; otherwise 5 years  eSigned:  Ladene Artist, MD, [facility] 01/29/2015 3:56 PM     PATIENT NAME:  Dominic Kelly, Dominic Kelly MR#: [id number]

## 2015-01-29 NOTE — Progress Notes (Signed)
Called to room to assist during endoscopic procedure.  Patient ID and intended procedure confirmed with present staff. Received instructions for my participation in the procedure from the performing physician.  

## 2015-01-29 NOTE — Patient Instructions (Signed)
YOU HAD AN ENDOSCOPIC PROCEDURE TODAY AT THE [facility]:   Refer to the procedure report that was given to you for any specific questions about what was found during the examination.  If the procedure report does not answer your questions, please call your gastroenterologist to clarify.  If you requested that your care partner not be given the details of your procedure findings, then the procedure report has been included in a sealed envelope for you to review at your convenience later.  YOU SHOULD EXPECT: Some feelings of bloating in the abdomen. Passage of more gas than usual.  Walking can help get rid of the air that was put into your GI tract during the procedure and reduce the bloating. If you had a lower endoscopy (such as a colonoscopy or flexible sigmoidoscopy) you may notice spotting of blood in your stool or on the toilet paper. If you underwent a bowel prep for your procedure, you may not have a normal bowel movement for a few days.  Please Note:  You might notice some irritation and congestion in your nose or some drainage.  This is from the oxygen used during your procedure.  There is no need for concern and it should clear up in a day or so.  SYMPTOMS TO REPORT IMMEDIATELY:   Following lower endoscopy (colonoscopy or flexible sigmoidoscopy):  Excessive amounts of blood in the stool  Significant tenderness or worsening of abdominal pains  Swelling of the abdomen that is new, acute  Fever of 100F or higher   For urgent or emergent issues, a gastroenterologist can be reached at any hour by calling [phone].   DIET: Your first meal following the procedure should be a small meal and then it is ok to progress to your normal diet. Heavy or fried foods are harder to digest and may make you feel nauseous or bloated.  Likewise, meals heavy in dairy and vegetables can increase bloating.  Drink plenty of fluids but you should avoid alcoholic beverages for 24 hours. Try to  increase the fiber in your diet.  ACTIVITY:  You should plan to take it easy for the rest of today and you should NOT DRIVE or use heavy machinery until tomorrow (because of the sedation medicines used during the test).    FOLLOW UP: Our staff will call the number listed on your records the next business day following your procedure to check on you and address any questions or concerns that you may have regarding the information given to you following your procedure. If we do not reach you, we will leave a message.  However, if you are feeling well and you are not experiencing any problems, there is no need to return our call.  We will assume that you have returned to your regular daily activities without incident.  If any biopsies were taken you will be contacted by phone or by letter within the next 1-3 weeks.  Please call us at [phone] if you have not heard about the biopsies in 3 weeks.    SIGNATURES/CONFIDENTIALITY: You and/or your care partner have signed paperwork which will be entered into your electronic medical record.  These signatures attest to the fact that that the information above on your After Visit Summary has been reviewed and is understood.  Full responsibility of the confidentiality of this discharge information lies with you and/or your care-partner.  Read all of the handouts given to you by your recovery room nurse. 

## 2015-01-30 ENCOUNTER — Telehealth: Payer: Self-pay | Admitting: Emergency Medicine

## 2015-01-30 NOTE — Telephone Encounter (Signed)
  Follow up Call-  Call back number 01/29/2015  Post procedure Call Back phone  # [phone]  Permission to leave phone message Yes     Patient questions:  Do you have a fever, pain , or abdominal swelling? No. Pain Score  0 *  Have you tolerated food without any problems? Yes.    Have you been able to return to your normal activities? Yes.    Do you have any questions about your discharge instructions: Diet   No. Medications  No. Follow up visit  No.  Do you have questions or concerns about your Care? No.  Actions: * If pain score is 4 or above: No action needed, pain <4.  Pt noted for 3 loose stool post d/c with slight blood, denies pain or n/v Sx's subsided last night- instructed to call if sx's returned or worsened

## 2015-02-06 ENCOUNTER — Encounter: Payer: Self-pay | Admitting: Gastroenterology

## 2015-03-27 DIAGNOSIS — Z85828 Personal history of other malignant neoplasm of skin: Secondary | ICD-10-CM | POA: Diagnosis not present

## 2015-03-27 DIAGNOSIS — L820 Inflamed seborrheic keratosis: Secondary | ICD-10-CM | POA: Diagnosis not present

## 2015-03-27 DIAGNOSIS — D225 Melanocytic nevi of trunk: Secondary | ICD-10-CM | POA: Diagnosis not present

## 2015-03-27 DIAGNOSIS — L812 Freckles: Secondary | ICD-10-CM | POA: Diagnosis not present

## 2015-03-27 DIAGNOSIS — L821 Other seborrheic keratosis: Secondary | ICD-10-CM | POA: Diagnosis not present

## 2015-03-27 DIAGNOSIS — L570 Actinic keratosis: Secondary | ICD-10-CM | POA: Diagnosis not present

## 2015-04-24 DIAGNOSIS — Z125 Encounter for screening for malignant neoplasm of prostate: Secondary | ICD-10-CM | POA: Diagnosis not present

## 2015-04-24 DIAGNOSIS — I10 Essential (primary) hypertension: Secondary | ICD-10-CM | POA: Diagnosis not present

## 2015-04-27 DIAGNOSIS — R972 Elevated prostate specific antigen [PSA]: Secondary | ICD-10-CM | POA: Diagnosis not present

## 2015-04-27 DIAGNOSIS — R21 Rash and other nonspecific skin eruption: Secondary | ICD-10-CM | POA: Diagnosis not present

## 2015-04-27 DIAGNOSIS — I10 Essential (primary) hypertension: Secondary | ICD-10-CM | POA: Diagnosis not present

## 2015-07-04 DIAGNOSIS — R972 Elevated prostate specific antigen [PSA]: Secondary | ICD-10-CM | POA: Diagnosis not present

## 2015-07-04 DIAGNOSIS — Z0000 Encounter for general adult medical examination without abnormal findings: Secondary | ICD-10-CM | POA: Diagnosis not present

## 2015-10-04 DIAGNOSIS — H2513 Age-related nuclear cataract, bilateral: Secondary | ICD-10-CM | POA: Diagnosis not present

## 2015-10-04 DIAGNOSIS — H5203 Hypermetropia, bilateral: Secondary | ICD-10-CM | POA: Diagnosis not present

## 2015-10-26 DIAGNOSIS — I10 Essential (primary) hypertension: Secondary | ICD-10-CM | POA: Diagnosis not present

## 2015-11-02 DIAGNOSIS — Z0000 Encounter for general adult medical examination without abnormal findings: Secondary | ICD-10-CM | POA: Diagnosis not present

## 2015-11-02 DIAGNOSIS — E7800 Pure hypercholesterolemia, unspecified: Secondary | ICD-10-CM | POA: Diagnosis not present

## 2015-11-02 DIAGNOSIS — R739 Hyperglycemia, unspecified: Secondary | ICD-10-CM | POA: Diagnosis not present

## 2015-11-02 DIAGNOSIS — N400 Enlarged prostate without lower urinary tract symptoms: Secondary | ICD-10-CM | POA: Diagnosis not present

## 2015-11-02 DIAGNOSIS — I10 Essential (primary) hypertension: Secondary | ICD-10-CM | POA: Diagnosis not present

## 2016-01-24 DIAGNOSIS — Z23 Encounter for immunization: Secondary | ICD-10-CM | POA: Diagnosis not present

## 2016-03-12 DIAGNOSIS — R972 Elevated prostate specific antigen [PSA]: Secondary | ICD-10-CM | POA: Diagnosis not present

## 2016-03-19 DIAGNOSIS — R972 Elevated prostate specific antigen [PSA]: Secondary | ICD-10-CM | POA: Diagnosis not present

## 2016-03-28 DIAGNOSIS — D1801 Hemangioma of skin and subcutaneous tissue: Secondary | ICD-10-CM | POA: Diagnosis not present

## 2016-03-28 DIAGNOSIS — L309 Dermatitis, unspecified: Secondary | ICD-10-CM | POA: Diagnosis not present

## 2016-03-28 DIAGNOSIS — D225 Melanocytic nevi of trunk: Secondary | ICD-10-CM | POA: Diagnosis not present

## 2016-03-28 DIAGNOSIS — Z85828 Personal history of other malignant neoplasm of skin: Secondary | ICD-10-CM | POA: Diagnosis not present

## 2016-03-28 DIAGNOSIS — L814 Other melanin hyperpigmentation: Secondary | ICD-10-CM | POA: Diagnosis not present

## 2016-03-28 DIAGNOSIS — L821 Other seborrheic keratosis: Secondary | ICD-10-CM | POA: Diagnosis not present

## 2016-03-28 DIAGNOSIS — L570 Actinic keratosis: Secondary | ICD-10-CM | POA: Diagnosis not present

## 2016-05-01 DIAGNOSIS — N400 Enlarged prostate without lower urinary tract symptoms: Secondary | ICD-10-CM | POA: Diagnosis not present

## 2016-05-01 DIAGNOSIS — I10 Essential (primary) hypertension: Secondary | ICD-10-CM | POA: Diagnosis not present

## 2016-05-01 DIAGNOSIS — R739 Hyperglycemia, unspecified: Secondary | ICD-10-CM | POA: Diagnosis not present

## 2016-05-01 DIAGNOSIS — Z125 Encounter for screening for malignant neoplasm of prostate: Secondary | ICD-10-CM | POA: Diagnosis not present

## 2016-05-08 DIAGNOSIS — I10 Essential (primary) hypertension: Secondary | ICD-10-CM | POA: Diagnosis not present

## 2016-05-08 DIAGNOSIS — R972 Elevated prostate specific antigen [PSA]: Secondary | ICD-10-CM | POA: Diagnosis not present

## 2016-05-08 DIAGNOSIS — Z0000 Encounter for general adult medical examination without abnormal findings: Secondary | ICD-10-CM | POA: Diagnosis not present

## 2016-05-08 DIAGNOSIS — N400 Enlarged prostate without lower urinary tract symptoms: Secondary | ICD-10-CM | POA: Diagnosis not present

## 2016-09-25 DIAGNOSIS — L570 Actinic keratosis: Secondary | ICD-10-CM | POA: Diagnosis not present

## 2016-09-25 DIAGNOSIS — L814 Other melanin hyperpigmentation: Secondary | ICD-10-CM | POA: Diagnosis not present

## 2016-09-25 DIAGNOSIS — L821 Other seborrheic keratosis: Secondary | ICD-10-CM | POA: Diagnosis not present

## 2016-10-08 DIAGNOSIS — H2513 Age-related nuclear cataract, bilateral: Secondary | ICD-10-CM | POA: Diagnosis not present

## 2016-10-08 DIAGNOSIS — H52203 Unspecified astigmatism, bilateral: Secondary | ICD-10-CM | POA: Diagnosis not present

## 2016-11-03 DIAGNOSIS — I10 Essential (primary) hypertension: Secondary | ICD-10-CM | POA: Diagnosis not present

## 2016-11-03 DIAGNOSIS — R972 Elevated prostate specific antigen [PSA]: Secondary | ICD-10-CM | POA: Diagnosis not present

## 2016-11-07 DIAGNOSIS — I10 Essential (primary) hypertension: Secondary | ICD-10-CM | POA: Diagnosis not present

## 2016-11-07 DIAGNOSIS — E7800 Pure hypercholesterolemia, unspecified: Secondary | ICD-10-CM | POA: Diagnosis not present

## 2016-11-07 DIAGNOSIS — R972 Elevated prostate specific antigen [PSA]: Secondary | ICD-10-CM | POA: Diagnosis not present

## 2016-11-07 DIAGNOSIS — Z0000 Encounter for general adult medical examination without abnormal findings: Secondary | ICD-10-CM | POA: Diagnosis not present

## 2016-11-07 DIAGNOSIS — N400 Enlarged prostate without lower urinary tract symptoms: Secondary | ICD-10-CM | POA: Diagnosis not present

## 2016-11-12 DIAGNOSIS — R972 Elevated prostate specific antigen [PSA]: Secondary | ICD-10-CM | POA: Diagnosis not present

## 2016-12-09 DIAGNOSIS — H2511 Age-related nuclear cataract, right eye: Secondary | ICD-10-CM | POA: Diagnosis not present

## 2016-12-09 DIAGNOSIS — H25811 Combined forms of age-related cataract, right eye: Secondary | ICD-10-CM | POA: Diagnosis not present

## 2016-12-23 DIAGNOSIS — H25812 Combined forms of age-related cataract, left eye: Secondary | ICD-10-CM | POA: Diagnosis not present

## 2016-12-23 DIAGNOSIS — H2512 Age-related nuclear cataract, left eye: Secondary | ICD-10-CM | POA: Diagnosis not present

## 2016-12-30 DIAGNOSIS — H52201 Unspecified astigmatism, right eye: Secondary | ICD-10-CM | POA: Diagnosis not present

## 2016-12-30 DIAGNOSIS — T8522XA Displacement of intraocular lens, initial encounter: Secondary | ICD-10-CM | POA: Diagnosis not present

## 2017-01-09 DIAGNOSIS — Z23 Encounter for immunization: Secondary | ICD-10-CM | POA: Diagnosis not present

## 2017-04-13 DIAGNOSIS — L570 Actinic keratosis: Secondary | ICD-10-CM | POA: Diagnosis not present

## 2017-04-29 DIAGNOSIS — R972 Elevated prostate specific antigen [PSA]: Secondary | ICD-10-CM | POA: Diagnosis not present

## 2017-05-06 DIAGNOSIS — R972 Elevated prostate specific antigen [PSA]: Secondary | ICD-10-CM | POA: Diagnosis not present

## 2017-05-06 DIAGNOSIS — N401 Enlarged prostate with lower urinary tract symptoms: Secondary | ICD-10-CM | POA: Diagnosis not present

## 2017-05-06 DIAGNOSIS — R351 Nocturia: Secondary | ICD-10-CM | POA: Diagnosis not present

## 2017-05-08 DIAGNOSIS — R972 Elevated prostate specific antigen [PSA]: Secondary | ICD-10-CM | POA: Diagnosis not present

## 2017-05-08 DIAGNOSIS — I10 Essential (primary) hypertension: Secondary | ICD-10-CM | POA: Diagnosis not present

## 2017-05-08 DIAGNOSIS — E7800 Pure hypercholesterolemia, unspecified: Secondary | ICD-10-CM | POA: Diagnosis not present

## 2017-05-15 DIAGNOSIS — R972 Elevated prostate specific antigen [PSA]: Secondary | ICD-10-CM | POA: Diagnosis not present

## 2017-05-15 DIAGNOSIS — Z0000 Encounter for general adult medical examination without abnormal findings: Secondary | ICD-10-CM | POA: Diagnosis not present

## 2017-05-15 DIAGNOSIS — Z23 Encounter for immunization: Secondary | ICD-10-CM | POA: Diagnosis not present

## 2017-05-15 DIAGNOSIS — R739 Hyperglycemia, unspecified: Secondary | ICD-10-CM | POA: Diagnosis not present

## 2017-05-15 DIAGNOSIS — I10 Essential (primary) hypertension: Secondary | ICD-10-CM | POA: Diagnosis not present

## 2017-10-13 DIAGNOSIS — L819 Disorder of pigmentation, unspecified: Secondary | ICD-10-CM | POA: Diagnosis not present

## 2017-10-13 DIAGNOSIS — L570 Actinic keratosis: Secondary | ICD-10-CM | POA: Diagnosis not present

## 2017-11-20 DIAGNOSIS — R739 Hyperglycemia, unspecified: Secondary | ICD-10-CM | POA: Diagnosis not present

## 2017-11-20 DIAGNOSIS — R972 Elevated prostate specific antigen [PSA]: Secondary | ICD-10-CM | POA: Diagnosis not present

## 2017-11-20 DIAGNOSIS — I10 Essential (primary) hypertension: Secondary | ICD-10-CM | POA: Diagnosis not present

## 2017-11-27 DIAGNOSIS — R972 Elevated prostate specific antigen [PSA]: Secondary | ICD-10-CM | POA: Diagnosis not present

## 2017-11-27 DIAGNOSIS — R011 Cardiac murmur, unspecified: Secondary | ICD-10-CM | POA: Diagnosis not present

## 2017-11-27 DIAGNOSIS — I10 Essential (primary) hypertension: Secondary | ICD-10-CM | POA: Diagnosis not present

## 2017-11-27 DIAGNOSIS — R739 Hyperglycemia, unspecified: Secondary | ICD-10-CM | POA: Diagnosis not present

## 2017-12-03 DIAGNOSIS — R011 Cardiac murmur, unspecified: Secondary | ICD-10-CM | POA: Diagnosis not present

## 2018-01-13 DIAGNOSIS — L570 Actinic keratosis: Secondary | ICD-10-CM | POA: Diagnosis not present

## 2018-01-13 DIAGNOSIS — L819 Disorder of pigmentation, unspecified: Secondary | ICD-10-CM | POA: Diagnosis not present

## 2018-01-13 DIAGNOSIS — D485 Neoplasm of uncertain behavior of skin: Secondary | ICD-10-CM | POA: Diagnosis not present

## 2018-01-13 DIAGNOSIS — B079 Viral wart, unspecified: Secondary | ICD-10-CM | POA: Diagnosis not present

## 2018-01-19 ENCOUNTER — Encounter: Payer: Self-pay | Admitting: Gastroenterology

## 2018-01-28 DIAGNOSIS — Z961 Presence of intraocular lens: Secondary | ICD-10-CM | POA: Diagnosis not present

## 2018-03-21 ENCOUNTER — Encounter: Payer: Self-pay | Admitting: Gastroenterology

## 2018-04-29 DIAGNOSIS — E7800 Pure hypercholesterolemia, unspecified: Secondary | ICD-10-CM | POA: Diagnosis not present

## 2018-04-29 DIAGNOSIS — E039 Hypothyroidism, unspecified: Secondary | ICD-10-CM | POA: Diagnosis not present

## 2018-04-29 DIAGNOSIS — E119 Type 2 diabetes mellitus without complications: Secondary | ICD-10-CM | POA: Diagnosis not present

## 2018-04-29 DIAGNOSIS — I10 Essential (primary) hypertension: Secondary | ICD-10-CM | POA: Diagnosis not present

## 2018-04-29 DIAGNOSIS — Z125 Encounter for screening for malignant neoplasm of prostate: Secondary | ICD-10-CM | POA: Diagnosis not present

## 2018-04-29 DIAGNOSIS — Z0000 Encounter for general adult medical examination without abnormal findings: Secondary | ICD-10-CM | POA: Diagnosis not present

## 2018-05-10 DIAGNOSIS — R972 Elevated prostate specific antigen [PSA]: Secondary | ICD-10-CM | POA: Diagnosis not present

## 2018-05-10 DIAGNOSIS — E7800 Pure hypercholesterolemia, unspecified: Secondary | ICD-10-CM | POA: Diagnosis not present

## 2018-05-10 DIAGNOSIS — I10 Essential (primary) hypertension: Secondary | ICD-10-CM | POA: Diagnosis not present

## 2018-05-10 DIAGNOSIS — M109 Gout, unspecified: Secondary | ICD-10-CM | POA: Diagnosis not present

## 2018-05-10 DIAGNOSIS — H6120 Impacted cerumen, unspecified ear: Secondary | ICD-10-CM | POA: Diagnosis not present

## 2018-05-10 DIAGNOSIS — R946 Abnormal results of thyroid function studies: Secondary | ICD-10-CM | POA: Diagnosis not present

## 2018-05-11 DIAGNOSIS — D485 Neoplasm of uncertain behavior of skin: Secondary | ICD-10-CM | POA: Diagnosis not present

## 2018-05-11 DIAGNOSIS — L820 Inflamed seborrheic keratosis: Secondary | ICD-10-CM | POA: Diagnosis not present

## 2018-05-11 DIAGNOSIS — L570 Actinic keratosis: Secondary | ICD-10-CM | POA: Diagnosis not present

## 2018-05-13 DIAGNOSIS — R972 Elevated prostate specific antigen [PSA]: Secondary | ICD-10-CM | POA: Diagnosis not present

## 2018-05-14 DIAGNOSIS — N401 Enlarged prostate with lower urinary tract symptoms: Secondary | ICD-10-CM | POA: Diagnosis not present

## 2018-05-14 DIAGNOSIS — R351 Nocturia: Secondary | ICD-10-CM | POA: Diagnosis not present

## 2018-05-14 DIAGNOSIS — R972 Elevated prostate specific antigen [PSA]: Secondary | ICD-10-CM | POA: Diagnosis not present

## 2018-09-07 DIAGNOSIS — L570 Actinic keratosis: Secondary | ICD-10-CM | POA: Diagnosis not present

## 2018-09-07 DIAGNOSIS — L819 Disorder of pigmentation, unspecified: Secondary | ICD-10-CM | POA: Diagnosis not present

## 2018-09-07 DIAGNOSIS — L0109 Other impetigo: Secondary | ICD-10-CM | POA: Diagnosis not present

## 2018-10-15 ENCOUNTER — Other Ambulatory Visit: Payer: Self-pay

## 2018-11-04 DIAGNOSIS — E7800 Pure hypercholesterolemia, unspecified: Secondary | ICD-10-CM | POA: Diagnosis not present

## 2018-11-04 DIAGNOSIS — M109 Gout, unspecified: Secondary | ICD-10-CM | POA: Diagnosis not present

## 2018-11-04 DIAGNOSIS — I10 Essential (primary) hypertension: Secondary | ICD-10-CM | POA: Diagnosis not present

## 2018-11-08 DIAGNOSIS — E039 Hypothyroidism, unspecified: Secondary | ICD-10-CM | POA: Diagnosis not present

## 2018-11-08 DIAGNOSIS — E7800 Pure hypercholesterolemia, unspecified: Secondary | ICD-10-CM | POA: Diagnosis not present

## 2018-11-08 DIAGNOSIS — Z23 Encounter for immunization: Secondary | ICD-10-CM | POA: Diagnosis not present

## 2018-11-08 DIAGNOSIS — R7303 Prediabetes: Secondary | ICD-10-CM | POA: Diagnosis not present

## 2018-11-08 DIAGNOSIS — I10 Essential (primary) hypertension: Secondary | ICD-10-CM | POA: Diagnosis not present

## 2018-11-23 DIAGNOSIS — R972 Elevated prostate specific antigen [PSA]: Secondary | ICD-10-CM | POA: Diagnosis not present

## 2018-11-30 DIAGNOSIS — R351 Nocturia: Secondary | ICD-10-CM | POA: Diagnosis not present

## 2018-11-30 DIAGNOSIS — N401 Enlarged prostate with lower urinary tract symptoms: Secondary | ICD-10-CM | POA: Diagnosis not present

## 2018-11-30 DIAGNOSIS — R972 Elevated prostate specific antigen [PSA]: Secondary | ICD-10-CM | POA: Diagnosis not present

## 2019-01-05 DIAGNOSIS — L819 Disorder of pigmentation, unspecified: Secondary | ICD-10-CM | POA: Diagnosis not present

## 2019-01-05 DIAGNOSIS — L0109 Other impetigo: Secondary | ICD-10-CM | POA: Diagnosis not present

## 2019-01-05 DIAGNOSIS — D485 Neoplasm of uncertain behavior of skin: Secondary | ICD-10-CM | POA: Diagnosis not present

## 2019-01-06 DIAGNOSIS — L820 Inflamed seborrheic keratosis: Secondary | ICD-10-CM | POA: Diagnosis not present

## 2019-02-01 DIAGNOSIS — C44529 Squamous cell carcinoma of skin of other part of trunk: Secondary | ICD-10-CM | POA: Diagnosis not present

## 2019-02-01 DIAGNOSIS — L905 Scar conditions and fibrosis of skin: Secondary | ICD-10-CM | POA: Diagnosis not present

## 2019-02-04 DIAGNOSIS — Z961 Presence of intraocular lens: Secondary | ICD-10-CM | POA: Diagnosis not present

## 2019-02-04 DIAGNOSIS — H26493 Other secondary cataract, bilateral: Secondary | ICD-10-CM | POA: Diagnosis not present

## 2019-02-08 DIAGNOSIS — I10 Essential (primary) hypertension: Secondary | ICD-10-CM | POA: Diagnosis not present

## 2019-02-08 DIAGNOSIS — R7303 Prediabetes: Secondary | ICD-10-CM | POA: Diagnosis not present

## 2019-02-08 DIAGNOSIS — E039 Hypothyroidism, unspecified: Secondary | ICD-10-CM | POA: Diagnosis not present

## 2019-02-15 DIAGNOSIS — E7800 Pure hypercholesterolemia, unspecified: Secondary | ICD-10-CM | POA: Diagnosis not present

## 2019-02-15 DIAGNOSIS — I10 Essential (primary) hypertension: Secondary | ICD-10-CM | POA: Diagnosis not present

## 2019-02-15 DIAGNOSIS — E039 Hypothyroidism, unspecified: Secondary | ICD-10-CM | POA: Diagnosis not present

## 2019-02-15 DIAGNOSIS — R7303 Prediabetes: Secondary | ICD-10-CM | POA: Diagnosis not present

## 2019-03-01 DIAGNOSIS — H26492 Other secondary cataract, left eye: Secondary | ICD-10-CM | POA: Diagnosis not present

## 2019-05-10 DIAGNOSIS — R7303 Prediabetes: Secondary | ICD-10-CM | POA: Diagnosis not present

## 2019-05-10 DIAGNOSIS — I10 Essential (primary) hypertension: Secondary | ICD-10-CM | POA: Diagnosis not present

## 2019-07-27 DIAGNOSIS — R739 Hyperglycemia, unspecified: Secondary | ICD-10-CM | POA: Diagnosis not present

## 2019-07-27 DIAGNOSIS — I10 Essential (primary) hypertension: Secondary | ICD-10-CM | POA: Diagnosis not present

## 2019-07-27 DIAGNOSIS — E7800 Pure hypercholesterolemia, unspecified: Secondary | ICD-10-CM | POA: Diagnosis not present

## 2019-07-27 DIAGNOSIS — E039 Hypothyroidism, unspecified: Secondary | ICD-10-CM | POA: Diagnosis not present

## 2019-08-03 DIAGNOSIS — E039 Hypothyroidism, unspecified: Secondary | ICD-10-CM | POA: Diagnosis not present

## 2019-08-03 DIAGNOSIS — M109 Gout, unspecified: Secondary | ICD-10-CM | POA: Diagnosis not present

## 2019-08-03 DIAGNOSIS — E119 Type 2 diabetes mellitus without complications: Secondary | ICD-10-CM | POA: Diagnosis not present

## 2019-08-03 DIAGNOSIS — E7800 Pure hypercholesterolemia, unspecified: Secondary | ICD-10-CM | POA: Diagnosis not present

## 2019-08-03 DIAGNOSIS — I10 Essential (primary) hypertension: Secondary | ICD-10-CM | POA: Diagnosis not present

## 2019-08-25 DIAGNOSIS — D692 Other nonthrombocytopenic purpura: Secondary | ICD-10-CM | POA: Diagnosis not present

## 2019-08-25 DIAGNOSIS — Z85828 Personal history of other malignant neoplasm of skin: Secondary | ICD-10-CM | POA: Diagnosis not present

## 2019-08-25 DIAGNOSIS — L570 Actinic keratosis: Secondary | ICD-10-CM | POA: Diagnosis not present

## 2019-08-25 DIAGNOSIS — L905 Scar conditions and fibrosis of skin: Secondary | ICD-10-CM | POA: Diagnosis not present

## 2019-08-25 DIAGNOSIS — D225 Melanocytic nevi of trunk: Secondary | ICD-10-CM | POA: Diagnosis not present

## 2019-11-30 DIAGNOSIS — N401 Enlarged prostate with lower urinary tract symptoms: Secondary | ICD-10-CM | POA: Diagnosis not present

## 2019-11-30 DIAGNOSIS — R972 Elevated prostate specific antigen [PSA]: Secondary | ICD-10-CM | POA: Diagnosis not present

## 2019-11-30 DIAGNOSIS — R351 Nocturia: Secondary | ICD-10-CM | POA: Diagnosis not present

## 2019-12-02 ENCOUNTER — Other Ambulatory Visit: Payer: Self-pay | Admitting: Urology

## 2019-12-02 DIAGNOSIS — R972 Elevated prostate specific antigen [PSA]: Secondary | ICD-10-CM

## 2019-12-25 ENCOUNTER — Ambulatory Visit
Admission: RE | Admit: 2019-12-25 | Discharge: 2019-12-25 | Disposition: A | Discharge status: Home / Self Care | Payer: Medicare Other | Source: Ambulatory Visit | Attending: Urology | Admitting: Urology

## 2019-12-25 DIAGNOSIS — R972 Elevated prostate specific antigen [PSA]: Secondary | ICD-10-CM | POA: Diagnosis not present

## 2019-12-25 DIAGNOSIS — E041 Nontoxic single thyroid nodule: Secondary | ICD-10-CM | POA: Diagnosis not present

## 2019-12-25 DIAGNOSIS — N402 Nodular prostate without lower urinary tract symptoms: Secondary | ICD-10-CM | POA: Diagnosis not present

## 2019-12-25 DIAGNOSIS — K5730 Diverticulosis of large intestine without perforation or abscess without bleeding: Secondary | ICD-10-CM | POA: Diagnosis not present

## 2019-12-25 MED ORDER — GADOBENATE DIMEGLUMINE 529 MG/ML IV SOLN
20.0000 mL | Freq: Once | INTRAVENOUS | Status: AC | PRN
  Administered 2019-12-25: 20 mL via INTRAVENOUS

## 2020-01-12 DIAGNOSIS — R972 Elevated prostate specific antigen [PSA]: Secondary | ICD-10-CM | POA: Diagnosis not present

## 2020-01-12 DIAGNOSIS — C61 Malignant neoplasm of prostate: Secondary | ICD-10-CM | POA: Diagnosis not present

## 2020-01-23 ENCOUNTER — Encounter: Payer: Self-pay | Admitting: Medical Oncology

## 2020-01-23 NOTE — Progress Notes (Signed)
I called pt to introduce myself as the Prostate Nurse Navigator and the Coordinator of the Prostate [place].  1. I confirmed with the patient he is aware of his referral to the clinic 11/12, arriving @8  am.   2. I discussed the format of the clinic and the physicians he will be seeing that day.  3. I discussed where the clinic is located and how to contact me.  4. I confirmed his address and informed him I would be mailing a packet of information and forms to be completed. I asked him to bring them with him the day of his appointment.   He voiced understanding of the above. I asked him to call me if he has any questions or concerns regarding his appointments or the forms he needs to complete.

## 2020-01-26 ENCOUNTER — Encounter: Payer: Self-pay | Admitting: Radiation Oncology

## 2020-01-26 ENCOUNTER — Encounter: Payer: Self-pay | Admitting: Medical Oncology

## 2020-01-26 NOTE — Progress Notes (Signed)
GU Location of Tumor / Histology: prostatic adenocarcinoma  If Prostate Cancer, Gleason Score is (4 + 3) and PSA is (5.49). Prostate volume: 71.5 grams  Dominic Kelly has been followed by Dr. Alinda Money for an elevated PSA and BPH. See values below.   11/2019  psa 5.49 11/2018  psa 3.76 04/2018  psa 4.09 04/2017  psa 3.40 10/2016  psa 4  02/2016 psa 3.13   Biopsies of prostate (if applicable) revealed:    Past/Anticipated interventions by urology, if any: prostate biopsy, referral to [facility]  Past/Anticipated interventions by medical oncology, if any: no  Weight changes, if any: denies  Bowel/Bladder complaints, if any: IPSS 19 SHIM 21 Reports having to strain to void. Reports urinary urgency. Reports nocturia. Reports an intermittent urine stream. Reports urinary leakage. Reports urinary hesitancy.  Endorses taking Flomax 0.4 mg once daily.   Nausea/Vomiting, if any: denies  Pain issues, if any:  denies  SAFETY ISSUES:  Prior radiation? denies  Pacemaker/ICD? denies  Possible current pregnancy? no, male patient  Is the patient on methotrexate? denies  Current Complaints / other details:  74 year old male. Single. Denies a family hx of prostate ca. Reports his mother had lung ca. Resides in [place].

## 2020-01-26 NOTE — Progress Notes (Signed)
Spoke with patient to confirm appointment for [facility] 11/12, arriving at 8 am. I reviewed location, registration and COVID protocol. I reminded him to bring his completed medical forms. He voice understanding of the above.

## 2020-01-27 ENCOUNTER — Encounter: Payer: Self-pay | Admitting: Medical Oncology

## 2020-01-27 ENCOUNTER — Encounter: Payer: Self-pay | Admitting: Radiation Oncology

## 2020-01-27 ENCOUNTER — Ambulatory Visit
Admission: RE | Admit: 2020-01-27 | Discharge: 2020-01-27 | Disposition: A | Discharge status: Home / Self Care | Payer: Medicare Other | Source: Ambulatory Visit | Attending: Radiation Oncology | Admitting: Radiation Oncology

## 2020-01-27 ENCOUNTER — Inpatient Hospital Stay: Payer: Medicare Other | Attending: Oncology | Admitting: Oncology

## 2020-01-27 ENCOUNTER — Other Ambulatory Visit: Payer: Self-pay

## 2020-01-27 ENCOUNTER — Encounter: Payer: Self-pay | Admitting: General Practice

## 2020-01-27 VITALS — BP 178/82 | HR 78 | Temp 97.9°F | Resp 18 | Ht 70.0 in | Wt 218.4 lb

## 2020-01-27 DIAGNOSIS — C61 Malignant neoplasm of prostate: Secondary | ICD-10-CM | POA: Insufficient documentation

## 2020-01-27 DIAGNOSIS — F17210 Nicotine dependence, cigarettes, uncomplicated: Secondary | ICD-10-CM | POA: Insufficient documentation

## 2020-01-27 DIAGNOSIS — Z79899 Other long term (current) drug therapy: Secondary | ICD-10-CM | POA: Insufficient documentation

## 2020-01-27 DIAGNOSIS — R9721 Rising PSA following treatment for malignant neoplasm of prostate: Secondary | ICD-10-CM | POA: Diagnosis not present

## 2020-01-27 DIAGNOSIS — Z803 Family history of malignant neoplasm of breast: Secondary | ICD-10-CM | POA: Diagnosis not present

## 2020-01-27 DIAGNOSIS — M109 Gout, unspecified: Secondary | ICD-10-CM | POA: Insufficient documentation

## 2020-01-27 DIAGNOSIS — I10 Essential (primary) hypertension: Secondary | ICD-10-CM | POA: Diagnosis not present

## 2020-01-27 DIAGNOSIS — Z801 Family history of malignant neoplasm of trachea, bronchus and lung: Secondary | ICD-10-CM | POA: Insufficient documentation

## 2020-01-27 DIAGNOSIS — Z8041 Family history of malignant neoplasm of ovary: Secondary | ICD-10-CM | POA: Insufficient documentation

## 2020-01-27 DIAGNOSIS — R972 Elevated prostate specific antigen [PSA]: Secondary | ICD-10-CM | POA: Diagnosis not present

## 2020-01-27 DIAGNOSIS — E785 Hyperlipidemia, unspecified: Secondary | ICD-10-CM | POA: Insufficient documentation

## 2020-01-27 HISTORY — DX: Malignant neoplasm of prostate: C61

## 2020-01-27 NOTE — Progress Notes (Signed)
                               Care Plan Summary  Name: Dominic Kelly DOB: [birth year]   Your Medical Team:   Urologist -  Dr. [name], [facility]  Radiation Oncologist - Dr. Tyler Pita, [facility]   Medical Oncologist - Dr. Zola Button, [place]  Recommendations: 1) PSMA scan to complete staging  2) Androgen deprivation (hormone injection) 6 months with external beam radiation    * These recommendations are based on information available as of today's consult.      Recommendations may change depending on the results of further tests or exams.  Next Steps: 1) Dr. Alinda Money will order PSMA scan   When appointments need to be scheduled, you will be contacted by [facility] and/or [facility].  Questions?  Please do not hesitate to call Cira Rue, RN, BSN, OCN at [phone] any questions or concerns.  Shirlean Mylar is your Oncology Nurse Navigator and is available to assist you while you're receiving your medical care at [facility].

## 2020-01-27 NOTE — Progress Notes (Signed)
[place] [facility] [place] Psychosocial Distress Screening Spiritual Care  Met with Saquan in [facility] to introduce [place] team/resources, reviewing distress screen per protocol.  The patient scored a 5 on the Psychosocial Distress Thermometer which indicates moderate distress. Also assessed for distress and other psychosocial needs.   ONCBCN DISTRESS SCREENING 01/27/2020  Screening Type Initial Screening  Distress experienced in past week (1-10) 5  Emotional problem type Nervousness/Anxiety;Adjusting to illness  Referral to support programs Yes   Mr Pretty reports that he has "come to terms with the diagnosis" and is using perspective to cope; in fact, he already notices that he is appreciating life more. He verbalized interest in support programming.  Follow up needed: No. Per Mr Warshaw, no other needs or concerns at this time, but he knows to reach out to Team with needs or questions as desired.   [place], [place], [facility] Pager [phone] Voicemail [phone]

## 2020-01-27 NOTE — Progress Notes (Signed)
Radiation Oncology         [phone] ________________________________  Multidisciplinary Prostate Cancer Clinic  Initial Radiation Oncology Consultation  Name: Dominic Kelly MRN: [id number]  Date: 01/27/2020  DOB: 08-10-1945  CC:[name], [name], MD  [name], MD   REFERRING PHYSICIAN: [name], MD  DIAGNOSIS: 74 y.o. gentleman with stage T1c adenocarcinoma of the prostate with a Gleason's score of 4+3 and a PSA of 5.49    ICD-10-CM   1. Malignant neoplasm of prostate (Dominic Kelly)  [place] ILLNESS::Dominic Kelly is a 74 y.o. gentleman.  He is an established urology patient, followed by Dr. Alinda Money for a number of years for BPH with LUTS and has been on flomax for this.  The patient has a history of a mildly elevated PSA, fluctuating in the 3-4 range but reluctant to proceed with biopsy.  More recently, his PSA increased to 5.49 at follow up on 11/30/2019, digital rectal examination performed at that time was benign.  He again expressed reluctance to proceed with biopsy, prompting a prostate MRI performed on 12/25/2019 showing 2.1 cm PI-RADS 5 lesion in the left peripheral zone posterolateral mid gland and posterior apex, with mild extracapsular extension and possible left neurovascular bundle involvement.  The estimated prostate volume was 65 cc and there was no evidence of pelvic lymphadenopathy or bone metastases.       The patient proceeded to MRI fusion biopsy of the prostate on 01/12/2020.  The prostate volume measured 71.5 cc by ultrasound.  Out of 16 core biopsies, 10 were positive, with the majority on the left.  The maximum Gleason score was 4+3, and this was seen in the left base lateral (with PNI).  Additionally, Gleason 3+4 was seen in all four of the ROI MRI lesion samples, as well as the left mid lateral, left apex lateral, left base, and left mid. A small amount of Gleason 3+3 was noted in the right base lateral.    The patient reviewed the  biopsy results with his urologist and he has kindly been referred today to the multidisciplinary prostate cancer clinic for presentation of pathology and radiology studies in our conference for discussion of potential radiation treatment options and clinical evaluation.  The consensus recommendation is to proceed with ST-ADT, concurrent with 5-1/2 weeks of daily external beam radiotherapy to the prostate.   PREVIOUS RADIATION THERAPY: No  PAST MEDICAL HISTORY:  has a past medical history of Adenomatous polyp of colon (01/2009), GERD (gastroesophageal reflux disease), Gout, Hyperlipidemia, Hypertension, Prostate cancer ([place]), and Skin lesion.    PAST SURGICAL HISTORY: Past Surgical History:  Procedure Laterality Date  . COLONOSCOPY    . NECK LESION BIOPSY     Left  . POLYPECTOMY      FAMILY HISTORY: family history includes Breast cancer in his maternal aunt; Heart disease in his father and paternal grandmother; Lung cancer in his maternal aunt and mother; Ovarian cancer in his sister; Uterine cancer in his sister.  SOCIAL HISTORY:  reports that he has been smoking cigarettes. He has been smoking about 0.00 packs per day. He has never used smokeless tobacco. He reports current alcohol use. He reports that he does not use drugs.  ALLERGIES: Lobster [shellfish allergy]  MEDICATIONS:  Current Outpatient Medications  Medication Sig Dispense Refill  . allopurinol (ZYLOPRIM) 100 MG tablet Take 100 mg by mouth daily.    Marland Kitchen aspirin 81 MG tablet Take 81 mg by mouth daily.    Marland Kitchen  atorvastatin (LIPITOR) 20 MG tablet Take 20 mg by mouth daily.    . cetirizine (ZYRTEC) 10 MG tablet Take 10 mg by mouth daily.    Marland Kitchen guaifenesin (ROBITUSSIN) 100 MG/5ML syrup Take 200 mg by mouth 3 (three) times daily as needed for cough.    . losartan (COZAAR) 50 MG tablet Take 50 mg by mouth daily.    . Multiple Vitamin (MULTIVITAMIN) capsule Take 1 capsule by mouth daily.    . Omega-3 Fatty Acids (FISH OIL) 1000 MG CAPS  Take 1 capsule by mouth daily.    . propranolol (INDERAL) 10 MG tablet Take 10 mg by mouth 2 (two) times daily.    . SUPER B COMPLEX/C PO Take by mouth.    . tamsulosin (FLOMAX) 0.4 MG CAPS capsule Take 0.4 mg by mouth daily.     No current facility-administered medications for this encounter.    REVIEW OF SYSTEMS:  On review of systems, the patient reports that he is doing well overall. He denies any chest pain, shortness of breath, cough, fevers, chills, night sweats, unintended weight changes. He denies any bowel disturbances, and denies abdominal pain, nausea or vomiting. He denies any new musculoskeletal or joint aches or pains. His IPSS was 19, indicating moderate urinary symptoms, mostly involving urgency and frequency. His SHIM was 21, indicating he has mild erectile dysfunction. A complete review of systems is obtained and is otherwise negative.   PHYSICAL EXAM:  Wt Readings from Last 3 Encounters:  01/27/20 218 lb 6.4 oz (99.1 kg)  01/29/15 207 lb (93.9 kg)  01/18/15 207 lb (93.9 kg)   Temp Readings from Last 3 Encounters:  01/27/20 97.9 F (36.6 C)  01/29/15 (!) 96 F (35.6 C) (Tympanic)  07/09/11 (!) 96.4 F (35.8 C)   BP Readings from Last 3 Encounters:  01/27/20 (!) 178/82  01/29/15 127/65  07/09/11 (!) 148/79   Pulse Readings from Last 3 Encounters:  01/27/20 78  01/29/15 70  07/09/11 66   Pain Assessment Pain Score: 0-No pain/10  In general this is a well appearing Caucasian male in no acute distress. He is alert and oriented x4 and appropriate throughout the examination. HEENT reveals that the patient is normocephalic, atraumatic. EOMs are intact. PERRLA. Skin is intact without any evidence of gross lesions. Cardiopulmonary assessment is negative for acute distress and he exhibits normal effort. The abdomen is soft, non tender, non distended. Lower extremities are negative for pretibial pitting edema, deep calf tenderness, cyanosis or clubbing.  KPS =  100  100 - Normal; no complaints; no evidence of disease. 90   - Able to carry on normal activity; minor signs or symptoms of disease. 80   - Normal activity with effort; some signs or symptoms of disease. 30   - Cares for self; unable to carry on normal activity or to do active work. 60   - Requires occasional assistance, but is able to care for most of his personal needs. 50   - Requires considerable assistance and frequent medical care. 56   - Disabled; requires special care and assistance. 66   - Severely disabled; hospital admission is indicated although death not imminent. 66   - Very sick; hospital admission necessary; active supportive treatment necessary. 10   - Moribund; fatal processes progressing rapidly. 0     - Dead  Karnofsky DA, Abelmann WH, Craver LS and [facility] [phone]) The use of the nitrogen mustards in the palliative treatment of carcinoma: with particular reference to  bronchogenic carcinoma Cancer 1 634-56   LABORATORY DATA:  No results found for: WBC, HGB, HCT, MCV, PLT No results found for: NA, K, CL, CO2 No results found for: ALT, AST, GGT, ALKPHOS, BILITOT   RADIOGRAPHY: No results found.    IMPRESSION/PLAN: 74 y.o. gentleman with Stage T1c adenocarcinoma of the prostate with a Gleason score of 4+3 and a PSA of 5.49.    We discussed the patient's workup and outlined the nature of prostate cancer in this setting. The patient's T stage, Gleason's score, and PSA put him into the unfavorable intermediate risk group. Accordingly, he is eligible for a variety of potential treatment options including prostatectomy or ST-ADT in combination with either brachytherapy or 5.5 weeks of external radiation.  We did discuss the concern for possible locally advanced disease based on findings on the most recent prostate MRI suggesting extraprostatic disease.  Therefore, the recommendation is to proceed with PSM a PET scan to complete his disease staging and if this study is not  approved by his insurance, we will proceed with bone scan.  We discussed the available radiation techniques, and focused on the details and logistics of delivery, pending he has no evidence of metastatic disease on upcoming imaging. The patient is not an ideal candidate for brachytherapy with a prostate volume of 70 cc and significant LUTS despite medical therapy. We discussed and outlined the risks, benefits, short and long-term effects associated with external beam radiotherapy and compared and contrasted these with prostatectomy. We discussed the role of SpaceOAR gel in reducing the rectal toxicity associated with radiotherapy. We also detailed the role of ST-ADT in the treatment of unfavorable intermediate risk prostate cancer and outlined the associated side effects that could be expected with this therapy. He appears to have a good understanding of his disease and our treatment recommendations which are of curative intent.  He was encouraged to ask questions that were answered to his stated satisfaction.  At the end of the conversation the patient is interested in moving forward with the recommended imaging for further evaluation to rule out locally advanced disease.  We will help to coordinate for a follow-up visit at [facility] urology to start ADT, first available.  Pending there is no evidence for metastatic disease on the studies, he is in agreement to proceed with ST-ADT concurrent with a 5-1/2-week course of daily prostate radiotherapy which would begin approximately 2 months after the start of ADT.  Dr. Alinda Money is planning to order the PSMA scan so we will stay in close communication regarding those results and move forward with treatment planning accordingly once those are available.  We enjoyed meeting him today and look forward to continue to participate in his care.     Nicholos Johns, PA-C    Tyler Pita, MD  [facility] Direct Dial: [phone]  Fax:  [phone] [email or web address]  Skype  [email or web address]   This document serves as a record of services personally performed by Tyler Pita, MD and Freeman Caldron, PA-C. It was created on their behalf by Wilburn Mylar, a trained medical scribe. The creation of this record is based on the scribe's personal observations and the provider's statements to them. This document has been checked and approved by the attending provider.

## 2020-01-27 NOTE — Progress Notes (Signed)
Reason for the request: Prostate cancer  HPI: I was asked by Dr.   to evaluate Dominic Kelly but evaluation of prostate cancer.  He is a 74 year old man with history of hypertension and gout and a longstanding history of elevated PSA dating back to 2018.  PSA at that time was 4.0.  He has had fluctuations in his PSA in the interim and in September 2021 his PSA was up to 5149.  Based on these findings, he underwent an MRI of the prostate on December 25, 2019.  The MRI showed a 2.1 cm peripheral zone nodule highly suspicious for high-grade carcinoma with mild extracapsular extension possible left neurovascular bundle involvement.  PI-RADS 5 designation was noted.  No abdominal adenopathy noted on his imaging.  He has subsequently underwent a prostate biopsy on January 12, 2020 utilizing MRI fusion technology.  The targeted lesion showed a prostate adenocarcinoma Gleason score 3+4 = 7 with pattern 4 is 10% in 2 samples.  He also had a another targeted lesion that showed 3+4 = 7.  He had Gleason score 4+37 in left lateral base and a Gleason score 3+3 = 6 of the right base.  Based on these findings, he was referred to the prostate cancer multidisciplinary clinic to discuss treatment options.  Clinically, he reports few urinary symptoms including nocturia as well as frequency and incomplete emptying.  He denies any hematuria or dysuria.   He does not report any headaches, blurry vision, syncope or seizures. Does not report any fevers, chills or sweats.  Does not report any cough, wheezing or hemoptysis.  Does not report any chest pain, palpitation, orthopnea or leg edema.  Does not report any nausea, vomiting or abdominal pain.  Does not report any constipation or diarrhea.  Does not report any skeletal complaints.    Does not report frequency, urgency or hematuria.  Does not report any skin rashes or lesions. Does not report any heat or cold intolerance.  Does not report any lymphadenopathy or petechiae.  Does not  report any anxiety or depression.  Remaining review of systems is negative.    Past Medical History:  Diagnosis Date  . Adenomatous polyp of colon 01/2009  . GERD (gastroesophageal reflux disease)   . Gout   . Hyperlipidemia   . Hypertension   . Prostate cancer ([place])   . Skin lesion    pre cancerous removed every year  :  Past Surgical History:  Procedure Laterality Date  . COLONOSCOPY    . NECK LESION BIOPSY     Left  . POLYPECTOMY    :   Current Outpatient Medications:  .  allopurinol (ZYLOPRIM) 100 MG tablet, Take 100 mg by mouth daily., Disp: , Rfl:  .  aspirin 81 MG tablet, Take 81 mg by mouth daily., Disp: , Rfl:  .  atorvastatin (LIPITOR) 20 MG tablet, Take 20 mg by mouth daily., Disp: , Rfl:  .  losartan (COZAAR) 50 MG tablet, Take 50 mg by mouth daily., Disp: , Rfl:  .  Multiple Vitamin (MULTIVITAMIN) capsule, Take 1 capsule by mouth daily., Disp: , Rfl:  .  Omega-3 Fatty Acids (FISH OIL) 1000 MG CAPS, Take 1 capsule by mouth daily., Disp: , Rfl:  .  propranolol (INDERAL) 10 MG tablet, Take 10 mg by mouth 2 (two) times daily., Disp: , Rfl:  .  tamsulosin (FLOMAX) 0.4 MG CAPS capsule, Take 0.4 mg by mouth daily., Disp: , Rfl: :  Allergies  Allergen Reactions  . Lobster [employer]  Allergy] Nausea And Vomiting  :  Family History  Problem Relation Age of Onset  . Heart disease Father   . Heart disease Paternal Grandmother   . Breast cancer Maternal Aunt   . Lung cancer Mother   . Lung cancer Maternal Aunt   . Ovarian cancer Sister   . Colon cancer Neg Hx   . Rectal cancer Neg Hx   . Stomach cancer Neg Hx   . Prostate cancer Neg Hx   :  Social History   Socioeconomic History  . Marital status: Single    Spouse name: Not on file  . Number of children: 0  . Years of education: Not on file  . Highest education level: Not on file  Occupational History  . Occupation: Retired    Fish farm manager: RETIRED  Tobacco Use  . Smoking status: Current Every Day Smoker     Packs/day: 0.00    Types: Cigarettes  . Smokeless tobacco: Never Used  . Tobacco comment: 2/3 of a pack a day   Substance and Sexual Activity  . Alcohol use: Yes    Alcohol/week: 0.0 standard drinks    Comment: occasional wine  . Drug use: No  . Sexual activity: Yes  Other Topics Concern  . Not on file  Social History Narrative  . Not on file   Social Determinants of Health   Financial Resource Strain:   . Difficulty of Paying Living Expenses: Not on file  Food Insecurity:   . Worried About Charity fundraiser in the Last Year: Not on file  . Ran Out of Food in the Last Year: Not on file  Transportation Needs:   . Lack of Transportation (Medical): Not on file  . Lack of Transportation (Non-Medical): Not on file  Physical Activity:   . Days of Exercise per Week: Not on file  . Minutes of Exercise per Session: Not on file  Stress:   . Feeling of Stress : Not on file  Social Connections:   . Frequency of Communication with Friends and Family: Not on file  . Frequency of Social Gatherings with Friends and Family: Not on file  . Attends Religious Services: Not on file  . Active Member of Clubs or Organizations: Not on file  . Attends Archivist Meetings: Not on file  . Marital Status: Not on file  Intimate Partner Violence:   . Fear of Current or Ex-Partner: Not on file  . Emotionally Abused: Not on file  . Physically Abused: Not on file  . Sexually Abused: Not on file  :  Pertinent items are noted in HPI.  Exam: ECOG 0 General appearance: alert and cooperative appeared without distress. Head: atraumatic without any abnormalities. Eyes: conjunctivae/corneas clear. PERRL.  Sclera anicteric. Throat: lips, mucosa, and tongue normal; without oral thrush or ulcers. Resp: clear to auscultation bilaterally without rhonchi, wheezes or dullness to percussion. Cardio: regular rate and rhythm, S1, S2 normal, no murmur, click, rub or gallop GI: soft, non-tender;  bowel sounds normal; no masses,  no organomegaly Skin: Skin color, texture, turgor normal. No rashes or lesions Lymph nodes: Cervical, supraclavicular, and axillary nodes normal. Neurologic: Grossly normal without any motor, sensory or deep tendon reflexes. Musculoskeletal: No joint deformity or effusion.    Assessment and Plan:   74 year old man with prostate cancer diagnosed in October 2021.  He was found to have PSA 5.49 and MRI fusion biopsy showed a prostate cancer and is 4+3 = 7 admitted targeted lesions as  well as at least 4 other cores of 3+4 = 7.  Extra prostatic extension is noted on his MRI.  His case was discussed today in the prostate cancer multidisciplinary clinic including review of his imaging studies with radiology.  His pathology results were discussed with the reviewing pathologist.  Treatment options were discussed at this time.  Definitive curative options are recommended at this time and the options include primary surgical therapy versus radiation.  Given his age and the tumor size and location primary surgical options carries a higher risk of complications and potential urinary function recovery.  Definitive therapy with radiation and androgen deprivation therapy as an alternative option were discussed.  It would be a reasonable option at this time and the rationale for using ADT was discussed.  Complications that include hot flashes, weight gain and sexual dysfunction were reiterated.  6 months of androgen deprivation would be reasonable at this time.  Updating his staging with bone scan would be reasonable as well make sure he did not have any metastatic disease.  The role for any additional systemic therapy including systemic chemotherapy, oral targeted therapy, and others were reviewed and will be deferred at this time unless he developed advanced disease.   He is agreeable with the radiation and androgen deprivation therapy option.  All his questions were answered to  his satisfaction.  45  minutes were dedicated to this visit. The time was spent on reviewing laboratory data, imaging studies, discussing treatment options,  and answering questions regarding future plan.     A copy of this consult has been forwarded to the requesting physician.

## 2020-01-27 NOTE — Consult Note (Signed)
Multi-Disciplinary Clinic     01/27/2020   --------------------------------------------------------------------------------   Dominic Kelly  MRN: [id number]  DOB: 10-18-1945, 74 year old Male  SSN: [id number]   PRIMARY CARE:  Teodora Medici. Maudie Mercury [facility]), MD  REFERRING:  [name], Eduardo Osier  PROVIDER:  [name], M.D.  LOCATION:  [facility] [phone]     --------------------------------------------------------------------------------   CC/HPI: CC: Prostate Cancer   PCP: Dr. Jani Gravel  Location of consult: [facility] - Prostate Cancer Multidisciplinary Clinic   Dominic Kelly is a 74 year old gentleman with a history of hypertension, gout, and hyperlipidemia who has a history of bothersome BPH/LUTS treated with tamsulosin for multiple years. Baseline IPSS is 17 on therapy. He was noted to have a rising PSA that increased to 5.49 prompting an MRI of the prostate on 12/25/19. This demonstrated a 2.1 cm PIRADS 5 lesion at the left posterolateral mid gland with concern for possible EPE. An MR/US fusion biopsy was performed on 01/12/20 and demonstrated 10 out of 16 biopsies overall to be positive for Gleason 4+3=7 adenocarcinoma including all 4 targeted cores and 5 out of 6 left sided systematic cores.   Family history: None.   Imaging studies:  MRI (12/25/19): Possible EPE on left, no SVI, no LAD, no bone lesions.   PMH: He has a history of hypertension, hyperlipidemia, and gout.  PSH: No abdominal surgeries.   TNM stage: cT1c N0 Mx  PSA: 5.49  Gleason score: 4+3=7 (GG 3)  Biopsy (01/12/20): 10/16 cores positive  Left: L lateral apex (20%, 3+4=7), L lateral mid (70%, 3+4=7), L mid (31%, 3+4=7), L lateral base (33%, 4+3=7), L base (31%, 3+4=7)  Right: R lateral base (6%, 3+3=6)  Targeted: 4/4 positive (60%, 43%, 3+4=7)  Prostate volume: 71.5 cc   Nomogram  OC disease: 27%  EPE: 71%  SVI: 17%  LNI: 14%  PFS (5 year, 10 year):  60%, 44%   Urinary function: IPSS is 19. His main complaints are urgency and frequency.  Erectile function: SHIM score is 21.     ALLERGIES: No Allergies    MEDICATIONS: Aspirin  Diazepam 10 mg tablet Take 10 mg 30-60 minutes prior to your procedure  Levofloxacin 750 mg tablet Please take one tablet the morning of your biopsy.  Lisinopril 5 mg tablet  Allopurinol 100 mg tablet Oral  Atorvastatin Calcium 20 MG Oral Tablet Oral  Blood Pressure Medication  Losartan Potassium 25 mg tablet Oral  Multivitamin  Omega-3 Fish Oil 300 mg-1,000 mg capsule,delayed release Oral  Propranolol Hcl 10 mg tablet Oral  Tamsulosin Hcl 0.4 mg capsule Oral     GU PSH: Biopsy Skin Lesion - 2012 Prostate Needle Biopsy - 01/12/2020       PSH Notes: Ear Surgery, Biopsy Skin   NON-GU PSH: Surgical Pathology, Gross And Microscopic Examination For Prostate Needle - 01/12/2020     GU PMH: Elevated PSA - 01/12/2020, - 11/30/2019, - 2018, Elevated prostate specific antigen (PSA), - 2017 BPH w/LUTS - 11/30/2019, - 2018 Nocturia - 11/30/2019, - 2019 Other microscopic hematuria, Microscopic hematuria - 2014      PMH Notes:   1) Microscopic hematuria: He had an isolated episode of microscopic hematuria in December 2011 which has not been persistent. He did smoke 2/3 pack of cigarettes for 20 years but quit in 2007. He has no family history of GU malignancy.   Feb 2012: NMP-22 negative   2) Elevated PSA: He presented to  me in April 2017 with a PSA of 4.0 at age 64. His calculated risk of clinically significant prostate cancer was 8%. He elected to forego a biopsy and monitor his PSA at that time. He has no family history of prostate cancer. His PSA has fluctuated around 4 since then.   3) BPH/LUTS:   Current treatment: Observation  Prior treatment: Tamsulosin     NON-GU PMH: GERD Hypercholesterolemia Hypertension    FAMILY HISTORY: Acute Myocardial Infarction - Father Lung Cancer - Mother    SOCIAL HISTORY: Marital Status: Single Preferred Language: English; Ethnicity: Not Hispanic Or Latino; Race: White Current Smoking Status: Patient does not smoke anymore.   Tobacco Use Assessment Completed: Used Tobacco in last 30 days? Does drink.  Drinks 1 caffeinated drink per day.     Notes: Former smoker, Marital History - Single, Tobacco Use, Alcohol Use   REVIEW OF SYSTEMS:    GU Review Male:   Patient denies frequent urination, hard to postpone urination, burning/ pain with urination, get up at night to urinate, leakage of urine, stream starts and stops, trouble starting your streams, and have to strain to urinate .  Gastrointestinal (Upper):   Patient denies nausea and vomiting.  Gastrointestinal (Lower):   Patient denies diarrhea and constipation.  Constitutional:   Patient denies fever, night sweats, weight loss, and fatigue.  Skin:   Patient denies skin rash/ lesion and itching.  Eyes:   Patient denies blurred vision and double vision.  Ears/ Nose/ Throat:   Patient denies sore throat and sinus problems.  Hematologic/Lymphatic:   Patient denies swollen glands and easy bruising.  Cardiovascular:   Patient denies leg swelling and chest pains.  Respiratory:   Patient denies cough and shortness of breath.  Endocrine:   Patient denies excessive thirst.  Musculoskeletal:   Patient denies back pain and joint pain.  Neurological:   Patient denies headaches and dizziness.  Psychologic:   Patient denies anxiety and depression.   VITAL SIGNS: None   MULTI-SYSTEM PHYSICAL EXAMINATION:    Constitutional: Well-nourished. No physical deformities. Normally developed. Good grooming.     Complexity of Data:  Lab Test Review:   PSA  Records Review:   Pathology Reports, Previous Patient Records  X-Ray Review: MRI Prostate GSORAD: Reviewed Films.     11/30/19 11/23/18 05/14/18 04/29/17 11/12/16 03/12/16  PSA  Total PSA 5.49 ng/mL 3.76 ng/mL 4.09 ng/mL 3.40 ng/mL 4.00 ng/mL 3.13    Free PSA 1.02 ng/mL  0.69 ng/mL     % Free PSA 19 % PSA  17 % PSA      Notes:                     CLINICAL DATA: Elevated PSA.   EXAM:  MR PROSTATE WITHOUT AND WITH CONTRAST   TECHNIQUE:  Multiplanar multisequence MRI images were obtained of the pelvis  centered about the prostate. Pre and post contrast images were  obtained.   CONTRAST: 60mL MULTIHANCE GADOBENATE DIMEGLUMINE 529 MG/ML IV SOLN   COMPARISON: None.   FINDINGS:  Prostate:   -- Peripheral Zone: An ill-defined T2 hypointense nodule is seen in  the left posterolateral mid gland and left posterior apex, measuring  2.1 x 1.0 cm on image 50/10. This nodule shows marked ADC  hypointensity, moderate DWI hyperintensity, and early focal contrast  enhancement. No other suspicious peripheral zone nodules identified.   -- Transition/Central Zone: Small circumscribed BPH nodules are  noted, but no suspicious nodules with obscured or  non-circumscribed  margins seen.   -- Measurements/Volume: 5.1 x 4.4 x 5.5 cm (volume = 65 cm^3)   Transcapsular spread: Focal capsular bulge and indistinctness is  seen in the left posterolateral mid gland, consistent with  extracapsular extension.   Seminal vesicle involvement: Absent   Neurovascular bundle involvement: Possible left neurovascular bundle  involvement.   Pelvic adenopathy: None visualized   Bone metastasis: None visualized   Other: Mild diffuse bladder wall thickening and trabeculation,  consistent with chronic bladder outlet obstruction. Sigmoid  diverticulosis, without evidence of diverticulitis.   IMPRESSION:  2.1 cm peripheral zone nodule in the left posterolateral mid gland  and posterior apex, highly suspicious for high-grade carcinoma. Mild  extracapsular extension demonstrated, with possible left  neurovascular bundle involvement. PI-RADS 5: Very high (clinically  significant cancer is highly likely to be present).   No evidence of pelvic lymphadenopathy  or bone metastases.   (I have processed this exam in the DynaCAD application for MR/US  fusion-guided biopsy if performed.)    Electronically Signed  By: Marlaine Hind M.D.  On: 12/25/2019 14:31   PROCEDURES: None   ASSESSMENT:      ICD-10 Details  1 GU:   Prostate Cancer - C61    PLAN:           Orders         Schedule X-Rays: 2 Weeks - PET Scan - 63 y/o with locally advanced prostate cancer noted on MRI. Clinical stage T3a, Gleason 4+3=7 adenocarcinoma. PSA 5.49.          Document Letter(s):  Created for Patient: Clinical Summary         Notes:   1. Prostate cancer: We reviewed his biopsy results today in the context of his MRI that does suggest a definite possibility of locally advanced prostate cancer. After reviewing his disease parameters and in the context of his life expectancy, we discussed recommendations. It was recommended that he proceed with therapy of curative intent. His case was reviewed in the GU tumor Board prior to clinic today. The patient was counseled about the natural history of prostate cancer and the standard treatment options that are available for prostate cancer. It was explained to him how his age and life expectancy, clinical stage, Gleason score, and PSA affect his prognosis, the decision to proceed with additional staging studies, as well as how that information influences recommended treatment strategies. We discussed the roles for active surveillance, radiation therapy, surgical therapy, androgen deprivation, as well as ablative therapy options for the treatment of prostate cancer as appropriate to his individual cancer situation. We discussed the risks and benefits of these options with regard to their impact on cancer control and also in terms of potential adverse events, complications, and impact on quality of life particularly related to urinary and sexual function. The patient was encouraged to ask questions throughout the discussion today and all  questions were answered to his stated satisfaction. In addition, the patient was provided with and/or directed to appropriate resources and literature for further education about prostate cancer and treatment options.   Although he did have some initial interest in surgical therapy, we discussed the increased risk of incontinence undergoing surgery at his age. We also discussed the option of proceeding with radiation therapy. We did not recommend brachytherapy considering his prostate volume and significant lower urinary tract symptoms. We therefore focused our discussion on external beam radiation therapy with intensity modulated radiation. Considering the question of locally advanced disease, we  also discussed proceeding with at least 6 months of systemic androgen deprivation although his clinical stage is technically T1c based on his digital rectal exam. Considering the possible concern for extraprostatic disease, we did recommend proceeding with additional staging. We will see if we can get a PSMA PET scan to complete his staging. Otherwise, if urge active by his insurance company, we will at least proceed with a technetium bone scan. Assuming he does not have evidence for metastatic disease, he does appear to be leaning toward primary radiation therapy with at least short-term androgen deprivation. He a had numerous questions today that were answered to his stated satisfaction. He also has tobacco getting a neither opinion and this was encouraged. We told him we would be happy to facilitate this for him if necessary. He is scheduled to see both Dr. Tammi Klippel and Dr. Alen Blew later this morning for further discussion.   Cc: Dr. Jani Gravel         Next Appointment:      Next Appointment: 11/28/2020 09:45 AM    Appointment Type: Laboratory Appointment    Location: [facility] [phone]    Provider: Lab LAB    Reason for Visit: 1 yr psa-Matt Delpizzo      E & M CODES: We spent 48 minutes  dedicated to evaluation and management time, including face to face interaction, discussions on coordination of care, documentation, result review, and discussion with others as applicable.

## 2020-01-30 ENCOUNTER — Encounter: Payer: Self-pay | Admitting: Medical Oncology

## 2020-01-30 DIAGNOSIS — E039 Hypothyroidism, unspecified: Secondary | ICD-10-CM | POA: Diagnosis not present

## 2020-01-30 DIAGNOSIS — E119 Type 2 diabetes mellitus without complications: Secondary | ICD-10-CM | POA: Diagnosis not present

## 2020-01-30 DIAGNOSIS — I10 Essential (primary) hypertension: Secondary | ICD-10-CM | POA: Diagnosis not present

## 2020-01-30 DIAGNOSIS — E7800 Pure hypercholesterolemia, unspecified: Secondary | ICD-10-CM | POA: Diagnosis not present

## 2020-01-30 DIAGNOSIS — Z0000 Encounter for general adult medical examination without abnormal findings: Secondary | ICD-10-CM | POA: Diagnosis not present

## 2020-01-30 DIAGNOSIS — Z125 Encounter for screening for malignant neoplasm of prostate: Secondary | ICD-10-CM | POA: Diagnosis not present

## 2020-01-30 NOTE — Progress Notes (Signed)
Informed patient Dr. Toula Moos placing order for ADT. I explained once insurance approval is granted, they will call him with an appointment. I asked him to call me with questions or concerns.

## 2020-01-30 NOTE — Progress Notes (Signed)
Returned call to patient. He has decided to move forward with  ST-ADT and 5.5 weeks of radiation. He states he has not heard about he PSAM scan. I explained it has to be approved by his insurance company before it can be scheduled. He voiced understanding. I notified Dr. Alinda Money of patients 's request.

## 2020-02-03 DIAGNOSIS — Z0000 Encounter for general adult medical examination without abnormal findings: Secondary | ICD-10-CM | POA: Diagnosis not present

## 2020-02-03 DIAGNOSIS — Z23 Encounter for immunization: Secondary | ICD-10-CM | POA: Diagnosis not present

## 2020-02-03 DIAGNOSIS — E039 Hypothyroidism, unspecified: Secondary | ICD-10-CM | POA: Diagnosis not present

## 2020-02-03 DIAGNOSIS — E119 Type 2 diabetes mellitus without complications: Secondary | ICD-10-CM | POA: Diagnosis not present

## 2020-02-03 DIAGNOSIS — I10 Essential (primary) hypertension: Secondary | ICD-10-CM | POA: Diagnosis not present

## 2020-02-03 DIAGNOSIS — E7800 Pure hypercholesterolemia, unspecified: Secondary | ICD-10-CM | POA: Diagnosis not present

## 2020-02-03 DIAGNOSIS — R011 Cardiac murmur, unspecified: Secondary | ICD-10-CM | POA: Diagnosis not present

## 2020-02-06 ENCOUNTER — Encounter: Payer: Self-pay | Admitting: Medical Oncology

## 2020-02-06 DIAGNOSIS — C61 Malignant neoplasm of prostate: Secondary | ICD-10-CM | POA: Diagnosis not present

## 2020-02-06 DIAGNOSIS — Z5111 Encounter for antineoplastic chemotherapy: Secondary | ICD-10-CM | POA: Diagnosis not present

## 2020-02-13 ENCOUNTER — Other Ambulatory Visit (HOSPITAL_COMMUNITY): Payer: Self-pay | Admitting: Urology

## 2020-02-13 DIAGNOSIS — C61 Malignant neoplasm of prostate: Secondary | ICD-10-CM

## 2020-02-14 ENCOUNTER — Encounter: Payer: Self-pay | Admitting: Urology

## 2020-02-14 NOTE — Progress Notes (Signed)
Patient got a 6 month Eligard injection on 02/06/20. PSMA scan scheduled for 02/20/20. Ailene Ards

## 2020-02-16 DIAGNOSIS — R011 Cardiac murmur, unspecified: Secondary | ICD-10-CM | POA: Diagnosis not present

## 2020-02-20 ENCOUNTER — Other Ambulatory Visit: Payer: Self-pay

## 2020-02-20 ENCOUNTER — Ambulatory Visit (HOSPITAL_COMMUNITY): Admission: RE | Admit: 2020-02-20 | Payer: Medicare Other | Source: Ambulatory Visit

## 2020-02-20 ENCOUNTER — Encounter (HOSPITAL_COMMUNITY)
Admission: RE | Admit: 2020-02-20 | Discharge: 2020-02-20 | Disposition: A | Discharge status: Home / Self Care | Payer: Medicare Other | Source: Ambulatory Visit | Attending: Urology | Admitting: Urology

## 2020-02-20 DIAGNOSIS — C61 Malignant neoplasm of prostate: Secondary | ICD-10-CM | POA: Diagnosis not present

## 2020-02-20 DIAGNOSIS — R918 Other nonspecific abnormal finding of lung field: Secondary | ICD-10-CM | POA: Diagnosis not present

## 2020-02-20 DIAGNOSIS — R911 Solitary pulmonary nodule: Secondary | ICD-10-CM | POA: Diagnosis not present

## 2020-02-20 MED ORDER — PIFLIFOLASTAT F 18 (PYLARIFY) INJECTION
9.0000 | Freq: Once | INTRAVENOUS | Status: AC
  Administered 2020-02-20: 9.4 via INTRAVENOUS

## 2020-02-22 DIAGNOSIS — Z23 Encounter for immunization: Secondary | ICD-10-CM | POA: Diagnosis not present

## 2020-02-24 DIAGNOSIS — H524 Presbyopia: Secondary | ICD-10-CM | POA: Diagnosis not present

## 2020-02-24 DIAGNOSIS — H26491 Other secondary cataract, right eye: Secondary | ICD-10-CM | POA: Diagnosis not present

## 2020-02-24 DIAGNOSIS — Z961 Presence of intraocular lens: Secondary | ICD-10-CM | POA: Diagnosis not present

## 2020-02-29 DIAGNOSIS — L905 Scar conditions and fibrosis of skin: Secondary | ICD-10-CM | POA: Diagnosis not present

## 2020-02-29 DIAGNOSIS — D229 Melanocytic nevi, unspecified: Secondary | ICD-10-CM | POA: Diagnosis not present

## 2020-02-29 DIAGNOSIS — L821 Other seborrheic keratosis: Secondary | ICD-10-CM | POA: Diagnosis not present

## 2020-02-29 DIAGNOSIS — L309 Dermatitis, unspecified: Secondary | ICD-10-CM | POA: Diagnosis not present

## 2020-02-29 DIAGNOSIS — D1801 Hemangioma of skin and subcutaneous tissue: Secondary | ICD-10-CM | POA: Diagnosis not present

## 2020-02-29 DIAGNOSIS — L814 Other melanin hyperpigmentation: Secondary | ICD-10-CM | POA: Diagnosis not present

## 2020-02-29 DIAGNOSIS — Z85828 Personal history of other malignant neoplasm of skin: Secondary | ICD-10-CM | POA: Diagnosis not present

## 2020-02-29 DIAGNOSIS — L819 Disorder of pigmentation, unspecified: Secondary | ICD-10-CM | POA: Diagnosis not present

## 2020-03-01 ENCOUNTER — Encounter: Payer: Self-pay | Admitting: Urology

## 2020-03-01 NOTE — Progress Notes (Signed)
PSMA scan did NOT show any evidence of metastatic disease so we will proceed with coordinating for FM and SO with Dr. Alinda Money in late Jan. 2022 in anticipation of beginning his treatments approximately 2 months after start of ADT. Patient received a 6 month Eligard injection on 02/06/20.  Nicholos Johns, MMS, PA-C [place] [facility] [place]: [phone]  Fax: [phone]

## 2020-03-23 ENCOUNTER — Other Ambulatory Visit: Payer: Self-pay | Admitting: Urology

## 2020-03-23 DIAGNOSIS — C61 Malignant neoplasm of prostate: Secondary | ICD-10-CM

## 2020-03-26 ENCOUNTER — Telehealth: Payer: Self-pay | Admitting: *Deleted

## 2020-03-26 NOTE — Telephone Encounter (Signed)
CALLED PATIENT TO INFORM OF SIM AND MRI APPT. ON 04-20-20- ARRIVAL TIME- 9:45 AM @ [facility]- ARRIVAL TIME- 12:30 PM @ [facility], SPOKE WITH PATIENT AND HE IS AWARE OF THESE APPTS.

## 2020-04-12 DIAGNOSIS — C61 Malignant neoplasm of prostate: Secondary | ICD-10-CM | POA: Diagnosis not present

## 2020-04-13 ENCOUNTER — Telehealth: Payer: Self-pay | Admitting: *Deleted

## 2020-04-13 NOTE — Telephone Encounter (Signed)
RETURNED PATIENT'S PHONE CALL, SPOKE WITH PATIENT. ?

## 2020-04-16 ENCOUNTER — Telehealth: Payer: Self-pay | Admitting: Radiation Oncology

## 2020-04-16 NOTE — Telephone Encounter (Signed)
Received voicemail message from patient inquiring about resuming aspirin and other supplements "after the procedure." Patient noted to have had fiducial markers and spaceoar placed by Dr. Jacalyn Lefevre on 04/12/20. Phoned [facility] to inquire. Cindy at [facility] committed to speak with Dr. Claudia Desanctis and phone patient back herself with direction. This RN then phoned the patient back and explained Jenny Reichmann from [facility] will be calling him after she consults with Dr. Claudia Desanctis. Confirmed patient's CT/Simulation appointment for 04/20/20 at 1000. Patient verbalized understanding of all reviewed and appreciation for the callback.

## 2020-04-17 DIAGNOSIS — L814 Other melanin hyperpigmentation: Secondary | ICD-10-CM | POA: Diagnosis not present

## 2020-04-17 DIAGNOSIS — I8310 Varicose veins of unspecified lower extremity with inflammation: Secondary | ICD-10-CM | POA: Diagnosis not present

## 2020-04-19 ENCOUNTER — Telehealth: Payer: Self-pay | Admitting: *Deleted

## 2020-04-19 NOTE — Telephone Encounter (Signed)
Called patient to remind of sim appt. for 04-20-20 - arrival time - 9:45 am @ [place], lvm for a return call

## 2020-04-20 ENCOUNTER — Other Ambulatory Visit: Payer: Self-pay

## 2020-04-20 ENCOUNTER — Ambulatory Visit
Admission: RE | Admit: 2020-04-20 | Discharge: 2020-04-20 | Disposition: A | Discharge status: Home / Self Care | Payer: Medicare Other | Source: Ambulatory Visit | Attending: Radiation Oncology | Admitting: Radiation Oncology

## 2020-04-20 ENCOUNTER — Encounter: Payer: Self-pay | Admitting: Medical Oncology

## 2020-04-20 ENCOUNTER — Ambulatory Visit (HOSPITAL_COMMUNITY): Payer: Medicare Other

## 2020-04-20 DIAGNOSIS — Z510 Encounter for antineoplastic radiation therapy: Secondary | ICD-10-CM | POA: Insufficient documentation

## 2020-04-20 DIAGNOSIS — C61 Malignant neoplasm of prostate: Secondary | ICD-10-CM | POA: Diagnosis present

## 2020-04-20 NOTE — Progress Notes (Signed)
  Radiation Oncology         [phone] ________________________________  Name: Dominic Kelly MRN: [id number]  Date: 04/20/2020  DOB: 08/31/1945  SIMULATION AND TREATMENT PLANNING NOTE    ICD-10-CM   1. Malignant neoplasm of prostate ([place])  C61     DIAGNOSIS:  75 y.o. gentleman with stage T1c adenocarcinoma of the prostate with a Gleason's score of 4+3 and a PSA of 5.49  NARRATIVE:  The patient was brought to the [facility].  Identity was confirmed.  All relevant records and images related to the planned course of therapy were reviewed.  The patient freely provided informed written consent to proceed with treatment after reviewing the details related to the planned course of therapy. The consent form was witnessed and verified by the simulation staff.  Then, the patient was set-up in a stable reproducible supine position for radiation therapy.  A vacuum lock pillow device was custom fabricated to position his legs in a reproducible immobilized position.  Then, I performed a urethrogram under sterile conditions to identify the prostatic apex.  CT images were obtained.  Surface markings were placed.  The CT images were loaded into the planning software.  Then the prostate target and avoidance structures including the rectum, bladder, bowel and hips were contoured.  Treatment planning then occurred.  The radiation prescription was entered and confirmed.  A total of one complex treatment devices was fabricated. I have requested : Intensity Modulated Radiotherapy (IMRT) is medically necessary for this case for the following reason:  Rectal sparing.Marland Kitchen  PLAN:  The patient will receive 70 Gy in 28 fractions.  ________________________________  Sheral Apley Tammi Klippel, M.D.  This document serves as a record of services personally performed by [place] Pita, MD. It was created on his behalf by Wilburn Mylar, a trained medical scribe. The creation of this record is based on the  scribe's personal observations and the provider's statements to them. This document has been checked and approved by the attending provider.

## 2020-04-26 DIAGNOSIS — C61 Malignant neoplasm of prostate: Secondary | ICD-10-CM | POA: Diagnosis not present

## 2020-04-26 DIAGNOSIS — Z510 Encounter for antineoplastic radiation therapy: Secondary | ICD-10-CM | POA: Diagnosis not present

## 2020-05-01 ENCOUNTER — Ambulatory Visit
Admission: RE | Admit: 2020-05-01 | Discharge: 2020-05-01 | Disposition: A | Discharge status: Home / Self Care | Payer: Medicare Other | Source: Ambulatory Visit | Attending: Radiation Oncology | Admitting: Radiation Oncology

## 2020-05-01 ENCOUNTER — Encounter: Payer: Self-pay | Admitting: Medical Oncology

## 2020-05-01 ENCOUNTER — Other Ambulatory Visit: Payer: Self-pay

## 2020-05-01 DIAGNOSIS — Z510 Encounter for antineoplastic radiation therapy: Secondary | ICD-10-CM | POA: Diagnosis not present

## 2020-05-01 DIAGNOSIS — C61 Malignant neoplasm of prostate: Secondary | ICD-10-CM | POA: Diagnosis not present

## 2020-05-02 ENCOUNTER — Ambulatory Visit
Admission: RE | Admit: 2020-05-02 | Discharge: 2020-05-02 | Disposition: A | Discharge status: Home / Self Care | Payer: Medicare Other | Source: Ambulatory Visit | Attending: Radiation Oncology | Admitting: Radiation Oncology

## 2020-05-02 ENCOUNTER — Other Ambulatory Visit: Payer: Self-pay

## 2020-05-02 DIAGNOSIS — Z510 Encounter for antineoplastic radiation therapy: Secondary | ICD-10-CM | POA: Diagnosis not present

## 2020-05-02 DIAGNOSIS — C61 Malignant neoplasm of prostate: Secondary | ICD-10-CM | POA: Diagnosis not present

## 2020-05-03 ENCOUNTER — Ambulatory Visit
Admission: RE | Admit: 2020-05-03 | Discharge: 2020-05-03 | Disposition: A | Discharge status: Home / Self Care | Payer: Medicare Other | Source: Ambulatory Visit | Attending: Radiation Oncology | Admitting: Radiation Oncology

## 2020-05-03 ENCOUNTER — Other Ambulatory Visit: Payer: Self-pay

## 2020-05-03 DIAGNOSIS — C61 Malignant neoplasm of prostate: Secondary | ICD-10-CM | POA: Diagnosis not present

## 2020-05-03 DIAGNOSIS — Z510 Encounter for antineoplastic radiation therapy: Secondary | ICD-10-CM | POA: Diagnosis not present

## 2020-05-04 ENCOUNTER — Ambulatory Visit
Admission: RE | Admit: 2020-05-04 | Discharge: 2020-05-04 | Disposition: A | Discharge status: Home / Self Care | Payer: Medicare Other | Source: Ambulatory Visit | Attending: Radiation Oncology | Admitting: Radiation Oncology

## 2020-05-04 ENCOUNTER — Other Ambulatory Visit: Payer: Self-pay

## 2020-05-04 DIAGNOSIS — C61 Malignant neoplasm of prostate: Secondary | ICD-10-CM | POA: Diagnosis not present

## 2020-05-04 DIAGNOSIS — Z510 Encounter for antineoplastic radiation therapy: Secondary | ICD-10-CM | POA: Diagnosis not present

## 2020-05-07 ENCOUNTER — Ambulatory Visit
Admission: RE | Admit: 2020-05-07 | Discharge: 2020-05-07 | Disposition: A | Discharge status: Home / Self Care | Payer: Medicare Other | Source: Ambulatory Visit | Attending: Radiation Oncology | Admitting: Radiation Oncology

## 2020-05-07 ENCOUNTER — Other Ambulatory Visit: Payer: Self-pay

## 2020-05-07 DIAGNOSIS — C61 Malignant neoplasm of prostate: Secondary | ICD-10-CM | POA: Diagnosis not present

## 2020-05-07 DIAGNOSIS — Z510 Encounter for antineoplastic radiation therapy: Secondary | ICD-10-CM | POA: Diagnosis not present

## 2020-05-08 ENCOUNTER — Ambulatory Visit
Admission: RE | Admit: 2020-05-08 | Discharge: 2020-05-08 | Disposition: A | Discharge status: Home / Self Care | Payer: Medicare Other | Source: Ambulatory Visit | Attending: Radiation Oncology | Admitting: Radiation Oncology

## 2020-05-08 ENCOUNTER — Other Ambulatory Visit: Payer: Self-pay

## 2020-05-08 DIAGNOSIS — Z510 Encounter for antineoplastic radiation therapy: Secondary | ICD-10-CM | POA: Diagnosis not present

## 2020-05-08 DIAGNOSIS — C61 Malignant neoplasm of prostate: Secondary | ICD-10-CM | POA: Diagnosis not present

## 2020-05-09 ENCOUNTER — Ambulatory Visit
Admission: RE | Admit: 2020-05-09 | Discharge: 2020-05-09 | Disposition: A | Discharge status: Home / Self Care | Payer: Medicare Other | Source: Ambulatory Visit | Attending: Radiation Oncology | Admitting: Radiation Oncology

## 2020-05-09 DIAGNOSIS — Z510 Encounter for antineoplastic radiation therapy: Secondary | ICD-10-CM | POA: Diagnosis not present

## 2020-05-09 DIAGNOSIS — C61 Malignant neoplasm of prostate: Secondary | ICD-10-CM | POA: Diagnosis not present

## 2020-05-10 ENCOUNTER — Ambulatory Visit
Admission: RE | Admit: 2020-05-10 | Discharge: 2020-05-10 | Disposition: A | Discharge status: Home / Self Care | Payer: Medicare Other | Source: Ambulatory Visit | Attending: Radiation Oncology | Admitting: Radiation Oncology

## 2020-05-10 DIAGNOSIS — Z510 Encounter for antineoplastic radiation therapy: Secondary | ICD-10-CM | POA: Diagnosis not present

## 2020-05-10 DIAGNOSIS — C61 Malignant neoplasm of prostate: Secondary | ICD-10-CM | POA: Diagnosis not present

## 2020-05-11 ENCOUNTER — Ambulatory Visit
Admission: RE | Admit: 2020-05-11 | Discharge: 2020-05-11 | Disposition: A | Discharge status: Home / Self Care | Payer: Medicare Other | Source: Ambulatory Visit | Attending: Radiation Oncology | Admitting: Radiation Oncology

## 2020-05-11 ENCOUNTER — Other Ambulatory Visit: Payer: Self-pay

## 2020-05-11 DIAGNOSIS — Z510 Encounter for antineoplastic radiation therapy: Secondary | ICD-10-CM | POA: Diagnosis not present

## 2020-05-11 DIAGNOSIS — C61 Malignant neoplasm of prostate: Secondary | ICD-10-CM | POA: Diagnosis not present

## 2020-05-14 ENCOUNTER — Other Ambulatory Visit: Payer: Self-pay

## 2020-05-14 ENCOUNTER — Ambulatory Visit
Admission: RE | Admit: 2020-05-14 | Discharge: 2020-05-14 | Disposition: A | Discharge status: Home / Self Care | Payer: Medicare Other | Source: Ambulatory Visit | Attending: Radiation Oncology | Admitting: Radiation Oncology

## 2020-05-14 DIAGNOSIS — Z510 Encounter for antineoplastic radiation therapy: Secondary | ICD-10-CM | POA: Diagnosis not present

## 2020-05-14 DIAGNOSIS — C61 Malignant neoplasm of prostate: Secondary | ICD-10-CM | POA: Diagnosis not present

## 2020-05-15 ENCOUNTER — Other Ambulatory Visit: Payer: Self-pay

## 2020-05-15 ENCOUNTER — Ambulatory Visit
Admission: RE | Admit: 2020-05-15 | Discharge: 2020-05-15 | Disposition: A | Discharge status: Home / Self Care | Payer: Medicare Other | Source: Ambulatory Visit | Attending: Radiation Oncology | Admitting: Radiation Oncology

## 2020-05-15 DIAGNOSIS — Z510 Encounter for antineoplastic radiation therapy: Secondary | ICD-10-CM | POA: Insufficient documentation

## 2020-05-15 DIAGNOSIS — C61 Malignant neoplasm of prostate: Secondary | ICD-10-CM | POA: Insufficient documentation

## 2020-05-16 ENCOUNTER — Ambulatory Visit
Admission: RE | Admit: 2020-05-16 | Discharge: 2020-05-16 | Disposition: A | Discharge status: Home / Self Care | Payer: Medicare Other | Source: Ambulatory Visit | Attending: Radiation Oncology | Admitting: Radiation Oncology

## 2020-05-16 ENCOUNTER — Other Ambulatory Visit: Payer: Self-pay

## 2020-05-16 DIAGNOSIS — Z510 Encounter for antineoplastic radiation therapy: Secondary | ICD-10-CM | POA: Diagnosis not present

## 2020-05-16 DIAGNOSIS — C61 Malignant neoplasm of prostate: Secondary | ICD-10-CM | POA: Diagnosis present

## 2020-05-16 DIAGNOSIS — I10 Essential (primary) hypertension: Secondary | ICD-10-CM | POA: Diagnosis not present

## 2020-05-17 ENCOUNTER — Other Ambulatory Visit: Payer: Self-pay

## 2020-05-17 ENCOUNTER — Ambulatory Visit
Admission: RE | Admit: 2020-05-17 | Discharge: 2020-05-17 | Disposition: A | Discharge status: Home / Self Care | Payer: Medicare Other | Source: Ambulatory Visit | Attending: Radiation Oncology | Admitting: Radiation Oncology

## 2020-05-17 DIAGNOSIS — Z510 Encounter for antineoplastic radiation therapy: Secondary | ICD-10-CM | POA: Diagnosis not present

## 2020-05-17 DIAGNOSIS — C61 Malignant neoplasm of prostate: Secondary | ICD-10-CM | POA: Diagnosis not present

## 2020-05-18 ENCOUNTER — Other Ambulatory Visit: Payer: Self-pay

## 2020-05-18 ENCOUNTER — Ambulatory Visit
Admission: RE | Admit: 2020-05-18 | Discharge: 2020-05-18 | Disposition: A | Discharge status: Home / Self Care | Payer: Medicare Other | Source: Ambulatory Visit | Attending: Radiation Oncology | Admitting: Radiation Oncology

## 2020-05-18 DIAGNOSIS — C61 Malignant neoplasm of prostate: Secondary | ICD-10-CM | POA: Diagnosis not present

## 2020-05-18 DIAGNOSIS — Z510 Encounter for antineoplastic radiation therapy: Secondary | ICD-10-CM | POA: Diagnosis not present

## 2020-05-21 ENCOUNTER — Ambulatory Visit
Admission: RE | Admit: 2020-05-21 | Discharge: 2020-05-21 | Disposition: A | Discharge status: Home / Self Care | Payer: Medicare Other | Source: Ambulatory Visit | Attending: Radiation Oncology | Admitting: Radiation Oncology

## 2020-05-21 DIAGNOSIS — C61 Malignant neoplasm of prostate: Secondary | ICD-10-CM | POA: Diagnosis not present

## 2020-05-21 DIAGNOSIS — Z510 Encounter for antineoplastic radiation therapy: Secondary | ICD-10-CM | POA: Diagnosis not present

## 2020-05-22 ENCOUNTER — Ambulatory Visit
Admission: RE | Admit: 2020-05-22 | Discharge: 2020-05-22 | Disposition: A | Discharge status: Home / Self Care | Payer: Medicare Other | Source: Ambulatory Visit | Attending: Radiation Oncology | Admitting: Radiation Oncology

## 2020-05-22 ENCOUNTER — Other Ambulatory Visit: Payer: Self-pay

## 2020-05-22 DIAGNOSIS — C61 Malignant neoplasm of prostate: Secondary | ICD-10-CM | POA: Diagnosis not present

## 2020-05-22 DIAGNOSIS — Z510 Encounter for antineoplastic radiation therapy: Secondary | ICD-10-CM | POA: Diagnosis not present

## 2020-05-23 ENCOUNTER — Ambulatory Visit
Admission: RE | Admit: 2020-05-23 | Discharge: 2020-05-23 | Disposition: A | Discharge status: Home / Self Care | Payer: Medicare Other | Source: Ambulatory Visit | Attending: Radiation Oncology | Admitting: Radiation Oncology

## 2020-05-23 DIAGNOSIS — C61 Malignant neoplasm of prostate: Secondary | ICD-10-CM | POA: Diagnosis not present

## 2020-05-23 DIAGNOSIS — Z510 Encounter for antineoplastic radiation therapy: Secondary | ICD-10-CM | POA: Diagnosis not present

## 2020-05-24 ENCOUNTER — Other Ambulatory Visit: Payer: Self-pay

## 2020-05-24 ENCOUNTER — Ambulatory Visit
Admission: RE | Admit: 2020-05-24 | Discharge: 2020-05-24 | Disposition: A | Discharge status: Home / Self Care | Payer: Medicare Other | Source: Ambulatory Visit | Attending: Radiation Oncology | Admitting: Radiation Oncology

## 2020-05-24 DIAGNOSIS — Z510 Encounter for antineoplastic radiation therapy: Secondary | ICD-10-CM | POA: Diagnosis not present

## 2020-05-24 DIAGNOSIS — C61 Malignant neoplasm of prostate: Secondary | ICD-10-CM | POA: Diagnosis not present

## 2020-05-25 ENCOUNTER — Ambulatory Visit
Admission: RE | Admit: 2020-05-25 | Discharge: 2020-05-25 | Disposition: A | Discharge status: Home / Self Care | Payer: Medicare Other | Source: Ambulatory Visit | Attending: Radiation Oncology | Admitting: Radiation Oncology

## 2020-05-25 ENCOUNTER — Other Ambulatory Visit: Payer: Self-pay | Admitting: Radiation Oncology

## 2020-05-25 ENCOUNTER — Other Ambulatory Visit: Payer: Self-pay

## 2020-05-25 DIAGNOSIS — Z510 Encounter for antineoplastic radiation therapy: Secondary | ICD-10-CM | POA: Diagnosis not present

## 2020-05-25 DIAGNOSIS — C61 Malignant neoplasm of prostate: Secondary | ICD-10-CM | POA: Diagnosis not present

## 2020-05-25 MED ORDER — TAMSULOSIN HCL 0.4 MG PO CAPS
0.8000 mg | ORAL_CAPSULE | Freq: Every day | ORAL | 0 refills | Status: DC
Start: 2020-05-25 — End: 2021-08-30

## 2020-05-28 ENCOUNTER — Ambulatory Visit
Admission: RE | Admit: 2020-05-28 | Discharge: 2020-05-28 | Disposition: A | Discharge status: Home / Self Care | Payer: Medicare Other | Source: Ambulatory Visit | Attending: Radiation Oncology | Admitting: Radiation Oncology

## 2020-05-28 ENCOUNTER — Telehealth: Payer: Self-pay | Admitting: Radiation Oncology

## 2020-05-28 ENCOUNTER — Other Ambulatory Visit: Payer: Self-pay

## 2020-05-28 DIAGNOSIS — C61 Malignant neoplasm of prostate: Secondary | ICD-10-CM | POA: Diagnosis not present

## 2020-05-28 DIAGNOSIS — Z510 Encounter for antineoplastic radiation therapy: Secondary | ICD-10-CM | POA: Diagnosis not present

## 2020-05-28 NOTE — Telephone Encounter (Signed)
Received 90 Day Prescription Request form from [place]. Completed form to the best of my ability authorizing a 90 script based on the verbal order I received from Dr. Lisbeth Renshaw. Fax confirmation of delivery obtained. Placed copy of fax in outbox to be scanned into EMR by administrative staff.

## 2020-05-29 ENCOUNTER — Ambulatory Visit
Admission: RE | Admit: 2020-05-29 | Discharge: 2020-05-29 | Disposition: A | Discharge status: Home / Self Care | Payer: Medicare Other | Source: Ambulatory Visit | Attending: Radiation Oncology | Admitting: Radiation Oncology

## 2020-05-29 ENCOUNTER — Other Ambulatory Visit: Payer: Self-pay

## 2020-05-29 DIAGNOSIS — Z510 Encounter for antineoplastic radiation therapy: Secondary | ICD-10-CM | POA: Diagnosis not present

## 2020-05-29 DIAGNOSIS — C61 Malignant neoplasm of prostate: Secondary | ICD-10-CM | POA: Diagnosis not present

## 2020-05-30 ENCOUNTER — Ambulatory Visit
Admission: RE | Admit: 2020-05-30 | Discharge: 2020-05-30 | Disposition: A | Discharge status: Home / Self Care | Payer: Medicare Other | Source: Ambulatory Visit | Attending: Radiation Oncology | Admitting: Radiation Oncology

## 2020-05-30 ENCOUNTER — Other Ambulatory Visit: Payer: Self-pay

## 2020-05-30 DIAGNOSIS — Z510 Encounter for antineoplastic radiation therapy: Secondary | ICD-10-CM | POA: Diagnosis not present

## 2020-05-30 DIAGNOSIS — I10 Essential (primary) hypertension: Secondary | ICD-10-CM | POA: Diagnosis not present

## 2020-05-30 DIAGNOSIS — C61 Malignant neoplasm of prostate: Secondary | ICD-10-CM | POA: Diagnosis not present

## 2020-05-31 ENCOUNTER — Ambulatory Visit
Admission: RE | Admit: 2020-05-31 | Discharge: 2020-05-31 | Disposition: A | Discharge status: Home / Self Care | Payer: Medicare Other | Source: Ambulatory Visit | Attending: Radiation Oncology | Admitting: Radiation Oncology

## 2020-05-31 DIAGNOSIS — Z510 Encounter for antineoplastic radiation therapy: Secondary | ICD-10-CM | POA: Diagnosis not present

## 2020-05-31 DIAGNOSIS — C61 Malignant neoplasm of prostate: Secondary | ICD-10-CM | POA: Diagnosis not present

## 2020-06-01 ENCOUNTER — Ambulatory Visit
Admission: RE | Admit: 2020-06-01 | Discharge: 2020-06-01 | Disposition: A | Discharge status: Home / Self Care | Payer: Medicare Other | Source: Ambulatory Visit | Attending: Radiation Oncology | Admitting: Radiation Oncology

## 2020-06-01 DIAGNOSIS — Z510 Encounter for antineoplastic radiation therapy: Secondary | ICD-10-CM | POA: Diagnosis not present

## 2020-06-01 DIAGNOSIS — C61 Malignant neoplasm of prostate: Secondary | ICD-10-CM | POA: Diagnosis not present

## 2020-06-04 ENCOUNTER — Ambulatory Visit
Admission: RE | Admit: 2020-06-04 | Discharge: 2020-06-04 | Disposition: A | Discharge status: Home / Self Care | Payer: Medicare Other | Source: Ambulatory Visit | Attending: Radiation Oncology | Admitting: Radiation Oncology

## 2020-06-04 ENCOUNTER — Other Ambulatory Visit: Payer: Self-pay

## 2020-06-04 DIAGNOSIS — C61 Malignant neoplasm of prostate: Secondary | ICD-10-CM | POA: Diagnosis not present

## 2020-06-04 DIAGNOSIS — Z510 Encounter for antineoplastic radiation therapy: Secondary | ICD-10-CM | POA: Diagnosis not present

## 2020-06-05 ENCOUNTER — Ambulatory Visit
Admission: RE | Admit: 2020-06-05 | Discharge: 2020-06-05 | Disposition: A | Discharge status: Home / Self Care | Payer: Medicare Other | Source: Ambulatory Visit | Attending: Radiation Oncology | Admitting: Radiation Oncology

## 2020-06-05 DIAGNOSIS — C61 Malignant neoplasm of prostate: Secondary | ICD-10-CM | POA: Diagnosis not present

## 2020-06-05 DIAGNOSIS — Z510 Encounter for antineoplastic radiation therapy: Secondary | ICD-10-CM | POA: Diagnosis not present

## 2020-06-06 ENCOUNTER — Other Ambulatory Visit: Payer: Self-pay

## 2020-06-06 ENCOUNTER — Ambulatory Visit
Admission: RE | Admit: 2020-06-06 | Discharge: 2020-06-06 | Disposition: A | Discharge status: Home / Self Care | Payer: Medicare Other | Source: Ambulatory Visit | Attending: Radiation Oncology | Admitting: Radiation Oncology

## 2020-06-06 DIAGNOSIS — C61 Malignant neoplasm of prostate: Secondary | ICD-10-CM | POA: Diagnosis not present

## 2020-06-06 DIAGNOSIS — Z510 Encounter for antineoplastic radiation therapy: Secondary | ICD-10-CM | POA: Diagnosis not present

## 2020-06-06 DIAGNOSIS — I10 Essential (primary) hypertension: Secondary | ICD-10-CM | POA: Diagnosis not present

## 2020-06-07 ENCOUNTER — Ambulatory Visit
Admission: RE | Admit: 2020-06-07 | Discharge: 2020-06-07 | Disposition: A | Discharge status: Home / Self Care | Payer: Medicare Other | Source: Ambulatory Visit | Attending: Radiation Oncology | Admitting: Radiation Oncology

## 2020-06-07 ENCOUNTER — Ambulatory Visit: Payer: Medicare Other

## 2020-06-07 DIAGNOSIS — C61 Malignant neoplasm of prostate: Secondary | ICD-10-CM | POA: Diagnosis not present

## 2020-06-07 DIAGNOSIS — Z510 Encounter for antineoplastic radiation therapy: Secondary | ICD-10-CM | POA: Diagnosis not present

## 2020-06-08 ENCOUNTER — Ambulatory Visit
Admission: RE | Admit: 2020-06-08 | Discharge: 2020-06-08 | Disposition: A | Discharge status: Home / Self Care | Payer: Medicare Other | Source: Ambulatory Visit | Attending: Radiation Oncology | Admitting: Radiation Oncology

## 2020-06-08 ENCOUNTER — Encounter: Payer: Self-pay | Admitting: Medical Oncology

## 2020-06-08 ENCOUNTER — Other Ambulatory Visit: Payer: Self-pay

## 2020-06-08 ENCOUNTER — Encounter: Payer: Self-pay | Admitting: Urology

## 2020-06-08 DIAGNOSIS — Z510 Encounter for antineoplastic radiation therapy: Secondary | ICD-10-CM | POA: Diagnosis not present

## 2020-06-08 DIAGNOSIS — C61 Malignant neoplasm of prostate: Secondary | ICD-10-CM | POA: Diagnosis not present

## 2020-07-12 ENCOUNTER — Ambulatory Visit
Admission: RE | Admit: 2020-07-12 | Discharge: 2020-07-12 | Disposition: A | Discharge status: Home / Self Care | Payer: Medicare Other | Source: Ambulatory Visit | Attending: Urology | Admitting: Urology

## 2020-07-12 ENCOUNTER — Other Ambulatory Visit: Payer: Self-pay

## 2020-07-12 DIAGNOSIS — C61 Malignant neoplasm of prostate: Secondary | ICD-10-CM

## 2020-07-12 NOTE — Progress Notes (Signed)
  Radiation Oncology         [phone] ________________________________  Name: Dominic Kelly MRN: [id number]  Date: 06/08/2020  DOB: [birth year]  End of Treatment Note  Diagnosis:   75 y.o.gentleman with stage T1cadenocarcinoma of the prostate with a Gleason's score of 4+3and a PSA of 5.49     Indication for treatment:  Curative, Definitive Radiotherapy       Radiation treatment dates:   05/01/20 - 06/08/20  Site/dose:   The prostate was treated to 70 Gy in 28 fractions of 2.5 Gy; concurrent with ST-ADT (received 6 month Eligard on 02/06/20)  Beams/energy:   The patient was treated with IMRT using volumetric arc therapy delivering 6 MV X-rays to clockwise and counterclockwise circumferential arcs with a 90 degree collimator offset to avoid dose scalloping.  Image guidance was performed with daily cone beam CT prior to each fraction to align to gold markers in the prostate and assure proper bladder and rectal fill volumes.  Immobilization was achieved with BodyFix custom mold.  Narrative: The patient tolerated radiation treatment relatively well with only minor urinary irritation and modest fatigue.  He reported nocturia 4-5 per night as well as increased urgency, intermittency, hesitancy and a few episodes of small-volume urge incontinence despite taking Flomax twice daily as prescribed.  He denied any dysuria or having to strain to void.  He did experience some diarrhea but denied abdominal pain, nausea or vomiting.  Plan: The patient has completed radiation treatment. He will return to radiation oncology clinic for routine followup in one month. I advised him to call or return sooner if he has any questions or concerns related to his recovery or treatment. ________________________________  Sheral Apley. Tammi Klippel, M.D.

## 2020-07-12 NOTE — Progress Notes (Signed)
Patient denies any dysuria or hematuria. Patient reports nocturia x 3. Patient reports some leakage and urgency. Patient states that he does not empty his bladder all the time . Patient reports that his stream is moderate to weak. Patient reports that he has to push and strain to start his stream sometime. Patient reports that he voids straight through. Patient states that he is having diarrhea ,advised that he could try imodium. Patient states that he has an appointment with his urologist ,but that he is unsure of date.Patient IPPS was 11.

## 2020-07-13 NOTE — Progress Notes (Signed)
Radiation Oncology         [phone] ________________________________  Name: Dominic Kelly MRN: [id number]  Date: 07/12/2020  DOB: [birth year]  Post Treatment Note  CC: Jani Gravel, MD  [name], MD  Diagnosis:   75 y.o.gentleman with stage T1cadenocarcinoma of the prostate with a Gleason's score of 4+3and a PSA of 5.49     Interval Since Last Radiation:  5 weeks  05/01/20 - 06/08/20:  The prostate was treated to 70 Gy in 28 fractions of 2.5 Gy; concurrent with ST-ADT (received 6 month Eligard on 02/06/20)  Narrative:  I spoke with the patient to conduct his routine scheduled 1 month follow up visit via telephone to spare the patient unnecessary potential exposure in the healthcare setting during the current COVID-19 pandemic.  The patient was notified in advance and gave permission to proceed with this visit format. He tolerated radiation treatment relatively well with only minor urinary irritation and modest fatigue.  He reported nocturia 4-5 per night as well as increased urgency, intermittency, hesitancy and a few episodes of small-volume urge incontinence despite taking Flomax twice daily as prescribed.  He denied any dysuria or having to strain to void.  He did experience some diarrhea but denied abdominal pain, nausea or vomiting.                              On review of systems, the patient states that he is doing well in general.  He reports improvement in his nocturia, currently getting up 2-3 times per night and also continues with increased urgency, frequency, weak stream, hesitancy and occasional small-volume incontinence.  His current IPSS score is 11, indicating mild to moderate urinary symptoms. He reports that prior to completing radiation, his bowels had pretty much returned to normal but approximately 7 to 10 days ago, he developed loose stool/diarrhea that has remained persistent.  He denies abdominal pain, nausea, vomiting or constipation.  He reports a healthy  appetite and is maintaining his weight.  He continues with modest fatigue and occasional hot flashes but feels that he is tolerating the ADT well.  Overall, he is quite pleased with his progress to date.  ALLERGIES:  is allergic to lobster [shellfish allergy].  Meds: Current Outpatient Medications  Medication Sig Dispense Refill  . allopurinol (ZYLOPRIM) 100 MG tablet Take 100 mg by mouth daily.    Marland Kitchen aspirin 81 MG tablet Take 81 mg by mouth daily.    Marland Kitchen atorvastatin (LIPITOR) 20 MG tablet Take 20 mg by mouth daily.    . hydrochlorothiazide (HYDRODIURIL) 25 MG tablet Take 1 tablet by mouth every morning.    Marland Kitchen losartan (COZAAR) 100 MG tablet Take 1 tablet by mouth daily.    . Multiple Vitamin (MULTIVITAMIN) capsule Take 1 capsule by mouth daily.    . Omega-3 Fatty Acids (FISH OIL) 1000 MG CAPS Take 1 capsule by mouth daily.    . propranolol (INDERAL) 10 MG tablet Take 10 mg by mouth 2 (two) times daily.    . SUPER B COMPLEX/C PO Take by mouth.    . tamsulosin (FLOMAX) 0.4 MG CAPS capsule Take 2 capsules (0.8 mg total) by mouth daily. 60 capsule 0  . cetirizine (ZYRTEC) 10 MG tablet Take 10 mg by mouth daily. (Patient not taking: Reported on 07/12/2020)    . guaifenesin (ROBITUSSIN) 100 MG/5ML syrup Take 200 mg by mouth 3 (three) times daily as needed for cough. (Patient  not taking: Reported on 07/12/2020)    . losartan (COZAAR) 50 MG tablet Take 50 mg by mouth daily. (Patient not taking: Reported on 07/12/2020)     No current facility-administered medications for this encounter.    Physical Findings:  vitals were not taken for this visit.   /Unable to assess due to telephone follow-up visit format.  Lab Findings: No results found for: WBC, HGB, HCT, MCV, PLT   Radiographic Findings: No results found.  Impression/Plan: 1. 75 y.o.gentleman with stage T1cadenocarcinoma of the prostate with a Gleason's score of 4+3and a PSA of 5.49. He will continue to follow up with urology for ongoing  PSA determinations and has an appointment scheduled for labs on 08/01/2020 and then will see Dr. Alinda Money on 08/08/2020. He understands what to expect with regards to PSA monitoring going forward. I will look forward to following his response to treatment via correspondence with urology, and would be happy to continue to participate in his care if clinically indicated. I talked to the patient about what to expect in the future, including his risk for erectile dysfunction and rectal bleeding. I encouraged him to call or return to the office if he has any questions regarding his previous radiation or possible radiation side effects. He was comfortable with this plan and will follow up as needed.       Nicholos Johns, PA-C

## 2020-07-14 DIAGNOSIS — Z23 Encounter for immunization: Secondary | ICD-10-CM | POA: Diagnosis not present

## 2020-07-26 DIAGNOSIS — R739 Hyperglycemia, unspecified: Secondary | ICD-10-CM | POA: Diagnosis not present

## 2020-07-26 DIAGNOSIS — E7800 Pure hypercholesterolemia, unspecified: Secondary | ICD-10-CM | POA: Diagnosis not present

## 2020-08-01 DIAGNOSIS — C61 Malignant neoplasm of prostate: Secondary | ICD-10-CM | POA: Diagnosis not present

## 2020-08-02 DIAGNOSIS — R7303 Prediabetes: Secondary | ICD-10-CM | POA: Diagnosis not present

## 2020-08-02 DIAGNOSIS — E7800 Pure hypercholesterolemia, unspecified: Secondary | ICD-10-CM | POA: Diagnosis not present

## 2020-08-02 DIAGNOSIS — I10 Essential (primary) hypertension: Secondary | ICD-10-CM | POA: Diagnosis not present

## 2020-08-02 DIAGNOSIS — R739 Hyperglycemia, unspecified: Secondary | ICD-10-CM | POA: Diagnosis not present

## 2020-08-02 DIAGNOSIS — M1A0790 Idiopathic chronic gout, unspecified ankle and foot, without tophus (tophi): Secondary | ICD-10-CM | POA: Diagnosis not present

## 2020-08-08 DIAGNOSIS — C61 Malignant neoplasm of prostate: Secondary | ICD-10-CM | POA: Diagnosis not present

## 2020-08-08 DIAGNOSIS — R351 Nocturia: Secondary | ICD-10-CM | POA: Diagnosis not present

## 2020-08-08 DIAGNOSIS — N401 Enlarged prostate with lower urinary tract symptoms: Secondary | ICD-10-CM | POA: Diagnosis not present

## 2020-10-15 DIAGNOSIS — D485 Neoplasm of uncertain behavior of skin: Secondary | ICD-10-CM | POA: Diagnosis not present

## 2020-10-15 DIAGNOSIS — I83813 Varicose veins of bilateral lower extremities with pain: Secondary | ICD-10-CM | POA: Diagnosis not present

## 2020-10-15 DIAGNOSIS — C44629 Squamous cell carcinoma of skin of left upper limb, including shoulder: Secondary | ICD-10-CM | POA: Diagnosis not present

## 2020-10-15 DIAGNOSIS — C44722 Squamous cell carcinoma of skin of right lower limb, including hip: Secondary | ICD-10-CM | POA: Diagnosis not present

## 2020-10-15 DIAGNOSIS — L570 Actinic keratosis: Secondary | ICD-10-CM | POA: Diagnosis not present

## 2020-11-08 DIAGNOSIS — C44629 Squamous cell carcinoma of skin of left upper limb, including shoulder: Secondary | ICD-10-CM | POA: Diagnosis not present

## 2020-11-08 DIAGNOSIS — C44722 Squamous cell carcinoma of skin of right lower limb, including hip: Secondary | ICD-10-CM | POA: Diagnosis not present

## 2020-11-08 DIAGNOSIS — D485 Neoplasm of uncertain behavior of skin: Secondary | ICD-10-CM | POA: Diagnosis not present

## 2020-11-22 DIAGNOSIS — L905 Scar conditions and fibrosis of skin: Secondary | ICD-10-CM | POA: Diagnosis not present

## 2020-11-22 DIAGNOSIS — D0462 Carcinoma in situ of skin of left upper limb, including shoulder: Secondary | ICD-10-CM | POA: Diagnosis not present

## 2020-11-22 DIAGNOSIS — D0471 Carcinoma in situ of skin of right lower limb, including hip: Secondary | ICD-10-CM | POA: Diagnosis not present

## 2020-11-22 DIAGNOSIS — D485 Neoplasm of uncertain behavior of skin: Secondary | ICD-10-CM | POA: Diagnosis not present

## 2020-12-20 DIAGNOSIS — I872 Venous insufficiency (chronic) (peripheral): Secondary | ICD-10-CM | POA: Diagnosis not present

## 2020-12-20 DIAGNOSIS — D485 Neoplasm of uncertain behavior of skin: Secondary | ICD-10-CM | POA: Diagnosis not present

## 2020-12-20 DIAGNOSIS — D0471 Carcinoma in situ of skin of right lower limb, including hip: Secondary | ICD-10-CM | POA: Diagnosis not present

## 2020-12-20 DIAGNOSIS — L570 Actinic keratosis: Secondary | ICD-10-CM | POA: Diagnosis not present

## 2021-01-01 DIAGNOSIS — D0471 Carcinoma in situ of skin of right lower limb, including hip: Secondary | ICD-10-CM | POA: Diagnosis not present

## 2021-01-01 DIAGNOSIS — Z4801 Encounter for change or removal of surgical wound dressing: Secondary | ICD-10-CM | POA: Diagnosis not present

## 2021-01-01 DIAGNOSIS — D485 Neoplasm of uncertain behavior of skin: Secondary | ICD-10-CM | POA: Diagnosis not present

## 2021-01-01 DIAGNOSIS — Z481 Encounter for planned postprocedural wound closure: Secondary | ICD-10-CM | POA: Diagnosis not present

## 2021-01-01 DIAGNOSIS — I872 Venous insufficiency (chronic) (peripheral): Secondary | ICD-10-CM | POA: Diagnosis not present

## 2021-01-01 DIAGNOSIS — Z48817 Encounter for surgical aftercare following surgery on the skin and subcutaneous tissue: Secondary | ICD-10-CM | POA: Diagnosis not present

## 2021-01-01 DIAGNOSIS — L905 Scar conditions and fibrosis of skin: Secondary | ICD-10-CM | POA: Diagnosis not present

## 2021-01-01 DIAGNOSIS — C44729 Squamous cell carcinoma of skin of left lower limb, including hip: Secondary | ICD-10-CM | POA: Diagnosis not present

## 2021-01-01 DIAGNOSIS — D049 Carcinoma in situ of skin, unspecified: Secondary | ICD-10-CM | POA: Diagnosis not present

## 2021-01-11 DIAGNOSIS — C61 Malignant neoplasm of prostate: Secondary | ICD-10-CM | POA: Diagnosis not present

## 2021-01-18 DIAGNOSIS — R351 Nocturia: Secondary | ICD-10-CM | POA: Diagnosis not present

## 2021-01-18 DIAGNOSIS — R311 Benign essential microscopic hematuria: Secondary | ICD-10-CM | POA: Diagnosis not present

## 2021-01-18 DIAGNOSIS — C61 Malignant neoplasm of prostate: Secondary | ICD-10-CM | POA: Diagnosis not present

## 2021-01-18 DIAGNOSIS — N401 Enlarged prostate with lower urinary tract symptoms: Secondary | ICD-10-CM | POA: Diagnosis not present

## 2021-01-29 DIAGNOSIS — R739 Hyperglycemia, unspecified: Secondary | ICD-10-CM | POA: Diagnosis not present

## 2021-01-29 DIAGNOSIS — M1A0790 Idiopathic chronic gout, unspecified ankle and foot, without tophus (tophi): Secondary | ICD-10-CM | POA: Diagnosis not present

## 2021-01-29 DIAGNOSIS — E7800 Pure hypercholesterolemia, unspecified: Secondary | ICD-10-CM | POA: Diagnosis not present

## 2021-01-29 DIAGNOSIS — R7303 Prediabetes: Secondary | ICD-10-CM | POA: Diagnosis not present

## 2021-01-29 DIAGNOSIS — I10 Essential (primary) hypertension: Secondary | ICD-10-CM | POA: Diagnosis not present

## 2021-01-31 DIAGNOSIS — Z0000 Encounter for general adult medical examination without abnormal findings: Secondary | ICD-10-CM | POA: Diagnosis not present

## 2021-01-31 DIAGNOSIS — Z23 Encounter for immunization: Secondary | ICD-10-CM | POA: Diagnosis not present

## 2021-02-01 DIAGNOSIS — Z23 Encounter for immunization: Secondary | ICD-10-CM | POA: Diagnosis not present

## 2021-02-05 DIAGNOSIS — R3129 Other microscopic hematuria: Secondary | ICD-10-CM | POA: Diagnosis not present

## 2021-02-05 DIAGNOSIS — I10 Essential (primary) hypertension: Secondary | ICD-10-CM | POA: Diagnosis not present

## 2021-02-05 DIAGNOSIS — E782 Mixed hyperlipidemia: Secondary | ICD-10-CM | POA: Diagnosis not present

## 2021-02-05 DIAGNOSIS — R7303 Prediabetes: Secondary | ICD-10-CM | POA: Diagnosis not present

## 2021-02-05 DIAGNOSIS — N1831 Chronic kidney disease, stage 3a: Secondary | ICD-10-CM | POA: Diagnosis not present

## 2021-02-05 DIAGNOSIS — M1A0790 Idiopathic chronic gout, unspecified ankle and foot, without tophus (tophi): Secondary | ICD-10-CM | POA: Diagnosis not present

## 2021-02-12 DIAGNOSIS — L300 Nummular dermatitis: Secondary | ICD-10-CM | POA: Diagnosis not present

## 2021-02-12 DIAGNOSIS — D0472 Carcinoma in situ of skin of left lower limb, including hip: Secondary | ICD-10-CM | POA: Diagnosis not present

## 2021-02-28 DIAGNOSIS — H26491 Other secondary cataract, right eye: Secondary | ICD-10-CM | POA: Diagnosis not present

## 2021-02-28 DIAGNOSIS — H349 Unspecified retinal vascular occlusion: Secondary | ICD-10-CM | POA: Diagnosis not present

## 2021-03-01 DIAGNOSIS — R3129 Other microscopic hematuria: Secondary | ICD-10-CM | POA: Diagnosis not present

## 2021-03-01 DIAGNOSIS — C61 Malignant neoplasm of prostate: Secondary | ICD-10-CM | POA: Diagnosis not present

## 2021-03-01 DIAGNOSIS — N401 Enlarged prostate with lower urinary tract symptoms: Secondary | ICD-10-CM | POA: Diagnosis not present

## 2021-03-01 DIAGNOSIS — R351 Nocturia: Secondary | ICD-10-CM | POA: Diagnosis not present

## 2021-03-13 DIAGNOSIS — R3129 Other microscopic hematuria: Secondary | ICD-10-CM | POA: Diagnosis not present

## 2021-03-13 DIAGNOSIS — K5730 Diverticulosis of large intestine without perforation or abscess without bleeding: Secondary | ICD-10-CM | POA: Diagnosis not present

## 2021-03-13 DIAGNOSIS — N400 Enlarged prostate without lower urinary tract symptoms: Secondary | ICD-10-CM | POA: Diagnosis not present

## 2021-03-13 DIAGNOSIS — N200 Calculus of kidney: Secondary | ICD-10-CM | POA: Diagnosis not present

## 2021-03-20 DIAGNOSIS — C61 Malignant neoplasm of prostate: Secondary | ICD-10-CM | POA: Diagnosis not present

## 2021-03-20 DIAGNOSIS — N401 Enlarged prostate with lower urinary tract symptoms: Secondary | ICD-10-CM | POA: Diagnosis not present

## 2021-03-20 DIAGNOSIS — R351 Nocturia: Secondary | ICD-10-CM | POA: Diagnosis not present

## 2021-03-20 DIAGNOSIS — R3121 Asymptomatic microscopic hematuria: Secondary | ICD-10-CM | POA: Diagnosis not present

## 2021-03-21 DIAGNOSIS — H26491 Other secondary cataract, right eye: Secondary | ICD-10-CM | POA: Diagnosis not present

## 2021-03-22 DIAGNOSIS — H43813 Vitreous degeneration, bilateral: Secondary | ICD-10-CM | POA: Diagnosis not present

## 2021-03-22 DIAGNOSIS — H353211 Exudative age-related macular degeneration, right eye, with active choroidal neovascularization: Secondary | ICD-10-CM | POA: Diagnosis not present

## 2021-03-22 DIAGNOSIS — H35362 Drusen (degenerative) of macula, left eye: Secondary | ICD-10-CM | POA: Diagnosis not present

## 2021-04-15 DIAGNOSIS — N471 Phimosis: Secondary | ICD-10-CM | POA: Diagnosis not present

## 2021-04-15 DIAGNOSIS — R310 Gross hematuria: Secondary | ICD-10-CM | POA: Diagnosis not present

## 2021-04-18 DIAGNOSIS — L570 Actinic keratosis: Secondary | ICD-10-CM | POA: Diagnosis not present

## 2021-04-18 DIAGNOSIS — Z08 Encounter for follow-up examination after completed treatment for malignant neoplasm: Secondary | ICD-10-CM | POA: Diagnosis not present

## 2021-04-18 DIAGNOSIS — Z86007 Personal history of in-situ neoplasm of skin: Secondary | ICD-10-CM | POA: Diagnosis not present

## 2021-04-18 DIAGNOSIS — L821 Other seborrheic keratosis: Secondary | ICD-10-CM | POA: Diagnosis not present

## 2021-04-18 DIAGNOSIS — D225 Melanocytic nevi of trunk: Secondary | ICD-10-CM | POA: Diagnosis not present

## 2021-04-18 DIAGNOSIS — L814 Other melanin hyperpigmentation: Secondary | ICD-10-CM | POA: Diagnosis not present

## 2021-04-19 DIAGNOSIS — H43813 Vitreous degeneration, bilateral: Secondary | ICD-10-CM | POA: Diagnosis not present

## 2021-04-19 DIAGNOSIS — H353121 Nonexudative age-related macular degeneration, left eye, early dry stage: Secondary | ICD-10-CM | POA: Diagnosis not present

## 2021-04-19 DIAGNOSIS — H353211 Exudative age-related macular degeneration, right eye, with active choroidal neovascularization: Secondary | ICD-10-CM | POA: Diagnosis not present

## 2021-05-17 DIAGNOSIS — H353121 Nonexudative age-related macular degeneration, left eye, early dry stage: Secondary | ICD-10-CM | POA: Diagnosis not present

## 2021-05-17 DIAGNOSIS — H35362 Drusen (degenerative) of macula, left eye: Secondary | ICD-10-CM | POA: Diagnosis not present

## 2021-05-17 DIAGNOSIS — H43813 Vitreous degeneration, bilateral: Secondary | ICD-10-CM | POA: Diagnosis not present

## 2021-05-17 DIAGNOSIS — H353211 Exudative age-related macular degeneration, right eye, with active choroidal neovascularization: Secondary | ICD-10-CM | POA: Diagnosis not present

## 2021-06-07 DIAGNOSIS — R338 Other retention of urine: Secondary | ICD-10-CM | POA: Diagnosis not present

## 2021-06-07 DIAGNOSIS — R8271 Bacteriuria: Secondary | ICD-10-CM | POA: Diagnosis not present

## 2021-06-19 DIAGNOSIS — R338 Other retention of urine: Secondary | ICD-10-CM | POA: Diagnosis not present

## 2021-06-28 DIAGNOSIS — H43813 Vitreous degeneration, bilateral: Secondary | ICD-10-CM | POA: Diagnosis not present

## 2021-06-28 DIAGNOSIS — H353121 Nonexudative age-related macular degeneration, left eye, early dry stage: Secondary | ICD-10-CM | POA: Diagnosis not present

## 2021-06-28 DIAGNOSIS — H353211 Exudative age-related macular degeneration, right eye, with active choroidal neovascularization: Secondary | ICD-10-CM | POA: Diagnosis not present

## 2021-07-03 DIAGNOSIS — R338 Other retention of urine: Secondary | ICD-10-CM | POA: Diagnosis not present

## 2021-07-03 DIAGNOSIS — R8271 Bacteriuria: Secondary | ICD-10-CM | POA: Diagnosis not present

## 2021-07-04 DIAGNOSIS — R8271 Bacteriuria: Secondary | ICD-10-CM | POA: Diagnosis not present

## 2021-07-04 DIAGNOSIS — R338 Other retention of urine: Secondary | ICD-10-CM | POA: Diagnosis not present

## 2021-07-04 DIAGNOSIS — N471 Phimosis: Secondary | ICD-10-CM | POA: Diagnosis not present

## 2021-07-07 ENCOUNTER — Emergency Department (HOSPITAL_COMMUNITY)
Admission: EM | Admit: 2021-07-07 | Discharge: 2021-07-08 | Disposition: A | Discharge status: Home / Self Care | Payer: Medicare Other | Attending: Emergency Medicine | Admitting: Emergency Medicine

## 2021-07-07 DIAGNOSIS — Z8546 Personal history of malignant neoplasm of prostate: Secondary | ICD-10-CM | POA: Diagnosis not present

## 2021-07-07 DIAGNOSIS — N3001 Acute cystitis with hematuria: Secondary | ICD-10-CM | POA: Insufficient documentation

## 2021-07-07 DIAGNOSIS — Y732 Prosthetic and other implants, materials and accessory gastroenterology and urology devices associated with adverse incidents: Secondary | ICD-10-CM | POA: Insufficient documentation

## 2021-07-07 DIAGNOSIS — Z7982 Long term (current) use of aspirin: Secondary | ICD-10-CM | POA: Insufficient documentation

## 2021-07-07 DIAGNOSIS — T83098A Other mechanical complication of other indwelling urethral catheter, initial encounter: Secondary | ICD-10-CM | POA: Insufficient documentation

## 2021-07-07 DIAGNOSIS — Z79899 Other long term (current) drug therapy: Secondary | ICD-10-CM | POA: Insufficient documentation

## 2021-07-07 DIAGNOSIS — R300 Dysuria: Secondary | ICD-10-CM | POA: Diagnosis present

## 2021-07-07 DIAGNOSIS — T839XXA Unspecified complication of genitourinary prosthetic device, implant and graft, initial encounter: Secondary | ICD-10-CM

## 2021-07-07 MED ORDER — LIDOCAINE HCL URETHRAL/MUCOSAL 2 % EX GEL: 1.0000 "application " | Freq: Once | CUTANEOUS | Status: DC

## 2021-07-07 NOTE — ED Triage Notes (Signed)
Pt came in with c/o catheter issue. First placed 4 weeks ago. Stopped draining earlier today. Stopped draining at 1700 ?

## 2021-07-08 ENCOUNTER — Other Ambulatory Visit: Payer: Self-pay

## 2021-07-08 ENCOUNTER — Encounter (HOSPITAL_COMMUNITY): Payer: Self-pay | Admitting: Emergency Medicine

## 2021-07-08 DIAGNOSIS — N3001 Acute cystitis with hematuria: Secondary | ICD-10-CM | POA: Diagnosis not present

## 2021-07-08 LAB — URINALYSIS, ROUTINE W REFLEX MICROSCOPIC
Bilirubin Urine: NEGATIVE
Glucose, UA: NEGATIVE mg/dL
Ketones, ur: NEGATIVE mg/dL
Nitrite: NEGATIVE
Protein, ur: 100 mg/dL — AB
RBC / HPF: >50 RBC/hpf — ABNORMAL HIGH (ref 0–5)
Specific Gravity, Urine: 1.018 (ref 1.005–1.030)
WBC, UA: >50 WBC/hpf — ABNORMAL HIGH (ref 0–5)
pH: 5 (ref 5.0–8.0)

## 2021-07-08 NOTE — ED Notes (Addendum)
Aspirated approx. 15 cc dark yellow urine with a single clot present. Orange sediment noted after flushing. Catheter draining well after aspiration. Flushed with 30 cc NS with good yellow urine return. 475 cc return.  ?

## 2021-07-08 NOTE — ED Provider Notes (Signed)
?[place] DEPT ?Provider Note ? ? ?CSN: [id number] ?Arrival date & time: 07/07/21  2330 ? ?  ? ?History ? ?Chief Complaint  ?Patient presents with  ? Dysuria  ?  Catheter problem  ? ? ?Dominic Kelly is a 76 y.o. male. ? ?The history is provided by the patient.  ?Dysuria ?Presenting symptoms: dysuria   ?Patient presents with Foley catheter issues.  He reports he has had this several weeks.  He has a previous history of prostate cancer.  Reports he has noted over the past several hours he had decreased urine output.  No fevers or vomiting.  He has had lower abdominal pain ?  ? ?Home Medications ?Prior to Admission medications   ?Medication Sig Start Date End Date Taking? Authorizing Provider  ?allopurinol (ZYLOPRIM) 100 MG tablet Take 100 mg by mouth daily.    [provider]  ?aspirin 81 MG tablet Take 81 mg by mouth daily.    [provider]  ?atorvastatin (LIPITOR) 20 MG tablet Take 20 mg by mouth daily.    [provider]  ?cetirizine (ZYRTEC) 10 MG tablet Take 10 mg by mouth daily. ?Patient not taking: Reported on 07/12/2020    [provider]  ?guaifenesin (ROBITUSSIN) 100 MG/5ML syrup Take 200 mg by mouth 3 (three) times daily as needed for cough. ?Patient not taking: Reported on 07/12/2020    [provider]  ?hydrochlorothiazide (HYDRODIURIL) 25 MG tablet Take 1 tablet by mouth every morning. 04/30/20   [provider]  ?losartan (COZAAR) 100 MG tablet Take 1 tablet by mouth daily. 05/16/20   [provider]  ?losartan (COZAAR) 50 MG tablet Take 50 mg by mouth daily. ?Patient not taking: Reported on 07/12/2020 10/06/19   [provider]  ?Multiple Vitamin (MULTIVITAMIN) capsule Take 1 capsule by mouth daily.    [provider]  ?Omega-3 Fatty Acids (FISH OIL) 1000 MG CAPS Take 1 capsule by mouth daily.    [provider]  ?propranolol (INDERAL) 10 MG tablet Take 10 mg by mouth 2 (two) times  daily.    [provider]  ?SUPER B COMPLEX/C PO Take by mouth.    [provider]  ?tamsulosin (FLOMAX) 0.4 MG CAPS capsule Take 2 capsules (0.8 mg total) by mouth daily. 05/25/20   Kyung Rudd, MD  ?   ? ?Allergies    ?Lobster [shellfish allergy]   ? ?Review of Systems   ?Review of Systems  ?Genitourinary:  Positive for dysuria.  ? ?Physical Exam ?Updated Vital Signs ?BP (!) 165/80   Pulse 71   Temp 97.8 ?F (36.6 ?C) (Oral)   Resp 18   Ht 1.778 m ('5\' 10"'$ )   Wt 81.6 kg   SpO2 97%   BMI 25.83 kg/m?  ?Physical Exam ?CONSTITUTIONAL: Elderly, no acute distress ?HEAD: Normocephalic/atraumatic ?EYES: EOMI/PERRL ?ENMT: Mucous membranes moist ?LUNGS:no apparent distress ?ABDOMEN: soft, nontender ?GU: Foley catheter in place, no complication ?NEURO: Pt is awake/alert/appropriate, moves all extremitiesx4.  No facial droop.   ?SKIN: warm, color normal ?PSYCH: no abnormalities of mood noted, alert and oriented to situation ? ?ED Results / Procedures / Treatments   ?Labs ?(all labs ordered are listed, but only abnormal results are displayed) ?Labs Reviewed  ?URINALYSIS, ROUTINE W REFLEX MICROSCOPIC - Abnormal; Notable for the following components:  ?    Result Value  ? Color, Urine AMBER (*)   ? APPearance CLOUDY (*)   ? Hgb urine dipstick LARGE (*)   ? Protein,  ur 100 (*)   ? Leukocytes,Ua LARGE (*)   ? RBC / HPF >50 (*)   ? WBC, UA >50 (*)   ? Bacteria, UA RARE (*)   ? All other components within normal limits  ? ? ?EKG ?None ? ?Radiology ?No results found. ? ?Procedures ?Procedures  ? ? ?Medications Ordered in ED ?Medications  ?lidocaine (XYLOCAINE) 2 % jelly 1 application. (1 application. Urethral Not Given 07/08/21 0006)  ? ? ?ED Course/ Medical Decision Making/ A&P ?  ?                        ?Medical Decision Making ? ?By the time of my evaluation patient already had his catheter flushed and is draining appropriately.  Urinalysis reveals UTI.  Will start on antibiotics and have him follow-up with  urology ?1:26 AM ?Patient reports he is already on antibiotics.  We will have him continue this and see urology ? ? ? ? ? ? ?Final Clinical Impression(s) / ED Diagnoses ?Final diagnoses:  ?None  ? ? ?Rx / DC Orders ?ED Discharge Orders   ? ? None  ? ?  ? ? ?  ?Ripley Fraise, MD ?07/08/21 0126 ? ?

## 2021-07-10 DIAGNOSIS — C61 Malignant neoplasm of prostate: Secondary | ICD-10-CM | POA: Diagnosis not present

## 2021-07-13 ENCOUNTER — Encounter (HOSPITAL_COMMUNITY): Payer: Self-pay

## 2021-07-13 ENCOUNTER — Emergency Department (HOSPITAL_COMMUNITY)
Admission: EM | Admit: 2021-07-13 | Discharge: 2021-07-13 | Disposition: A | Discharge status: Home / Self Care | Payer: Medicare Other | Attending: Emergency Medicine | Admitting: Emergency Medicine

## 2021-07-13 DIAGNOSIS — Z7982 Long term (current) use of aspirin: Secondary | ICD-10-CM | POA: Diagnosis not present

## 2021-07-13 DIAGNOSIS — T83511A Infection and inflammatory reaction due to indwelling urethral catheter, initial encounter: Secondary | ICD-10-CM | POA: Insufficient documentation

## 2021-07-13 DIAGNOSIS — N390 Urinary tract infection, site not specified: Secondary | ICD-10-CM | POA: Insufficient documentation

## 2021-07-13 LAB — URINALYSIS, ROUTINE W REFLEX MICROSCOPIC
Bilirubin Urine: NEGATIVE
Glucose, UA: NEGATIVE mg/dL
Ketones, ur: NEGATIVE mg/dL
Nitrite: NEGATIVE
Protein, ur: 100 mg/dL — AB
RBC / HPF: >50 RBC/hpf — ABNORMAL HIGH (ref 0–5)
Specific Gravity, Urine: 1.018 (ref 1.005–1.030)
WBC, UA: >50 WBC/hpf — ABNORMAL HIGH (ref 0–5)
pH: 5 (ref 5.0–8.0)

## 2021-07-13 MED ORDER — CIPROFLOXACIN HCL 500 MG PO TABS: 500.0000 mg | ORAL_TABLET | Freq: Two times a day (BID) | ORAL | 0 refills | Status: DC

## 2021-07-13 MED ORDER — LIDOCAINE HCL URETHRAL/MUCOSAL 2 % EX GEL
1.0000 "application " | Freq: Once | CUTANEOUS | Status: AC
  Administered 2021-07-13: 1 via URETHRAL
  Filled 2021-07-13: qty 11

## 2021-07-13 NOTE — ED Triage Notes (Signed)
Pt presents to ED c/o decreased urine output  noticed at 0230, no output noted around 0600. Hx of similar problem. Slowly increasing abdominal pain d/t retention. ?

## 2021-07-13 NOTE — Discharge Instructions (Addendum)
Get help right away if you have severe pelvic pain, flank pain, fevers, chills, nausea, vomiting or if your catheter becomes obstructed again.  Please keep your upcoming appointment with Dr. Alinda Money ?

## 2021-07-13 NOTE — ED Provider Notes (Signed)
?[place] DEPT ?Provider Note ? ? ?CSN: [id number] ?Arrival date & time: 07/13/21  8264 ? ?  ? ?History ? ?Chief Complaint  ?Patient presents with  ? catheter issue  ? ? ?Dominic Kelly is a 76 y.o. male who presents with catheter obstruction.  Patient has a chronic indwelling Foley catheter due to a history of radiation therapy for prostate cancer.  Patient states that he has noticed decreased output overnight and then has not had any urinary output since 6 AM, feels urinary urgency and lower abdominal up discomfort.  Patient states this feels the same as his last visit which was on 07/07/2021.  Patient was diagnosed with urinary tract infection based on his urine however ? ?The history is provided by the patient. No language interpreter was used.  ? ?  ? ?Home Medications ?Prior to Admission medications   ?Medication Sig Start Date End Date Taking? Authorizing Provider  ?allopurinol (ZYLOPRIM) 100 MG tablet Take 100 mg by mouth daily.    [provider]  ?aspirin 81 MG tablet Take 81 mg by mouth daily.    [provider]  ?atorvastatin (LIPITOR) 20 MG tablet Take 20 mg by mouth daily.    [provider]  ?cetirizine (ZYRTEC) 10 MG tablet Take 10 mg by mouth daily. ?Patient not taking: Reported on 07/12/2020    [provider]  ?ciprofloxacin (CIPRO) 500 MG tablet Take 1 tablet (500 mg total) by mouth every 12 (twelve) hours. 07/13/21   Margarita Mail, PA-C  ?guaifenesin (ROBITUSSIN) 100 MG/5ML syrup Take 200 mg by mouth 3 (three) times daily as needed for cough. ?Patient not taking: Reported on 07/12/2020    [provider]  ?hydrochlorothiazide (HYDRODIURIL) 25 MG tablet Take 1 tablet by mouth every morning. 04/30/20   [provider]  ?losartan (COZAAR) 100 MG tablet Take 1 tablet by mouth daily. 05/16/20   [provider]  ?losartan (COZAAR) 50 MG tablet Take 50 mg by mouth daily. ?Patient not taking: Reported on  07/12/2020 10/06/19   [provider]  ?Multiple Vitamin (MULTIVITAMIN) capsule Take 1 capsule by mouth daily.    [provider]  ?Omega-3 Fatty Acids (FISH OIL) 1000 MG CAPS Take 1 capsule by mouth daily.    [provider]  ?propranolol (INDERAL) 10 MG tablet Take 10 mg by mouth 2 (two) times daily.    [provider]  ?SUPER B COMPLEX/C PO Take by mouth.    [provider]  ?tamsulosin (FLOMAX) 0.4 MG CAPS capsule Take 2 capsules (0.8 mg total) by mouth daily. 05/25/20   Kyung Rudd, MD  ?   ? ?Allergies    ?Lobster [shellfish allergy]   ? ?Review of Systems   ?Review of Systems ? ?Physical Exam ?Updated Vital Signs ?BP (!) 154/72   Pulse 70   Temp 97.6 ?F (36.4 ?C) (Oral)   Resp 18   SpO2 96%  ?Physical Exam ? ?ED Results / Procedures / Treatments   ?Labs ?(all labs ordered are listed, but only abnormal results are displayed) ?Labs Reviewed  ?URINALYSIS, ROUTINE W REFLEX MICROSCOPIC - Abnormal; Notable for the following components:  ?    Result Value  ? APPearance CLOUDY (*)   ? Hgb urine dipstick SMALL (*)   ? Protein, ur 100 (*)   ? Leukocytes,Ua LARGE (*)   ? RBC / HPF >50 (*)   ? WBC, UA >50 (*)   ? Bacteria, UA FEW (*)   ? All other  components within normal limits  ?URINE CULTURE  ? ? ?EKG ?None ? ?Radiology ?No results found. ? ?Procedures ?Procedures  ? ? ?Medications Ordered in ED ?Medications  ?lidocaine (XYLOCAINE) 2 % jelly 1 application. (1 application. Urethral Given 07/13/21 0906)  ? ? ?ED Course/ Medical Decision Making/ A&P ?  ?                        ?Medical Decision Making ?76 year old male here with catheter obstruction.  This is a second time in the week his catheter has become obstructed.  6 days ago the patient had his catheter flushed.  Urinalysis was performed from the indwelling catheter which has been in place now for almost 5 weeks was 5 weeks.  Patient agreed to let me change the catheter and obtain a fresh sample.  Urine does appear  infected.  Patient just finished a round of antibiotics.  He is unsure what he took.  I have ordered Cipro.  He has a follow-up with his urologist in this coming week.  Discussed outcomes outpatient follow-up and return precautions. ? ?Amount and/or Complexity of Data Reviewed ?Labs: ordered. ? ?Risk ?Prescription drug management. ? ? ? ? ? ? ? ? ? ? ?Final Clinical Impression(s) / ED Diagnoses ?Final diagnoses:  ?Urinary tract infection associated with indwelling urethral catheter, initial encounter ([place])  ? ? ?Rx / DC Orders ?ED Discharge Orders   ? ?      Ordered  ?  ciprofloxacin (CIPRO) 500 MG tablet  Every 12 hours,   Status:  Discontinued       ? 07/13/21 1031  ?  ciprofloxacin (CIPRO) 500 MG tablet  Every 12 hours       ? 07/13/21 1111  ? ?  ?  ? ?  ? ? ?  ?Margarita Mail, PA-C ?07/13/21 1759 ? ?  ?Regan Lemming, MD ?07/14/21 8325 ? ?

## 2021-07-15 LAB — URINE CULTURE: Culture: 70000 — AB

## 2021-07-16 ENCOUNTER — Telehealth: Payer: Self-pay | Admitting: Emergency Medicine

## 2021-07-16 NOTE — Telephone Encounter (Signed)
Post ED Visit - Positive Culture Follow-up ? ?Culture report reviewed by antimicrobial stewardship pharmacist: ?[place] Team ?'[]'$  Elenor Quinones, Pharm.D. ?'[]'$  Heide Guile, Pharm.D., BCPS AQ-ID ?'[]'$  Parks Neptune, Pharm.D., BCPS ?'[]'$  Alycia Rossetti, Pharm.D., BCPS ?'[]'$  [place], Pharm.D., BCPS, AAHIVP ?'[]'$  Legrand Como, Pharm.D., BCPS, AAHIVP ?'[]'$  Salome Arnt, PharmD, BCPS ?'[]'$  Johnnette Gourd, PharmD, BCPS ?'[]'$  Hughes Better, PharmD, BCPS ?'[]'$  Leeroy Cha, PharmD ?'[]'$  Laqueta Linden, PharmD, BCPS ?'[]'$  Albertina Parr, PharmD ? ?[place] Team ?'[]'$  Leodis Sias, PharmD ?'[]'$  Lindell Spar, PharmD ?'[]'$  Royetta Asal, PharmD ?'[]'$  Graylin Shiver, Rph ?'[]'$  Rema Fendt) Glennon Mac, PharmD ?'[]'$  Arlyn Dunning, PharmD ?'[]'$  Netta Cedars, PharmD ?'[]'$  Dia Sitter, PharmD ?'[]'$  Leone Haven, PharmD ?'[]'$  Gretta Arab, PharmD ?'[]'$  Theodis Shove, PharmD ?'[]'$  Peggyann Juba, PharmD ?'[]'$  Reuel Boom, PharmD ? ? ?Positive urine culture ?Treated with ciprofloxacin, to followup with urology, no further patient follow-up is required at this time. ? ?Hazle Nordmann ?07/16/2021, 10:51 AM ?  ?

## 2021-07-17 DIAGNOSIS — R338 Other retention of urine: Secondary | ICD-10-CM | POA: Diagnosis not present

## 2021-07-17 DIAGNOSIS — C61 Malignant neoplasm of prostate: Secondary | ICD-10-CM | POA: Diagnosis not present

## 2021-07-18 DIAGNOSIS — R338 Other retention of urine: Secondary | ICD-10-CM | POA: Diagnosis not present

## 2021-08-15 DIAGNOSIS — R338 Other retention of urine: Secondary | ICD-10-CM | POA: Diagnosis not present

## 2021-08-16 DIAGNOSIS — C61 Malignant neoplasm of prostate: Secondary | ICD-10-CM | POA: Diagnosis not present

## 2021-08-16 DIAGNOSIS — N320 Bladder-neck obstruction: Secondary | ICD-10-CM | POA: Diagnosis not present

## 2021-08-16 DIAGNOSIS — R338 Other retention of urine: Secondary | ICD-10-CM | POA: Diagnosis not present

## 2021-08-19 DIAGNOSIS — Z86007 Personal history of in-situ neoplasm of skin: Secondary | ICD-10-CM | POA: Diagnosis not present

## 2021-08-19 DIAGNOSIS — L570 Actinic keratosis: Secondary | ICD-10-CM | POA: Diagnosis not present

## 2021-08-19 DIAGNOSIS — Z08 Encounter for follow-up examination after completed treatment for malignant neoplasm: Secondary | ICD-10-CM | POA: Diagnosis not present

## 2021-08-19 DIAGNOSIS — L821 Other seborrheic keratosis: Secondary | ICD-10-CM | POA: Diagnosis not present

## 2021-08-19 DIAGNOSIS — L814 Other melanin hyperpigmentation: Secondary | ICD-10-CM | POA: Diagnosis not present

## 2021-08-19 DIAGNOSIS — D225 Melanocytic nevi of trunk: Secondary | ICD-10-CM | POA: Diagnosis not present

## 2021-08-19 DIAGNOSIS — Z85828 Personal history of other malignant neoplasm of skin: Secondary | ICD-10-CM | POA: Diagnosis not present

## 2021-08-21 DIAGNOSIS — N1831 Chronic kidney disease, stage 3a: Secondary | ICD-10-CM | POA: Diagnosis not present

## 2021-08-21 DIAGNOSIS — R3129 Other microscopic hematuria: Secondary | ICD-10-CM | POA: Diagnosis not present

## 2021-08-21 DIAGNOSIS — I10 Essential (primary) hypertension: Secondary | ICD-10-CM | POA: Diagnosis not present

## 2021-08-21 DIAGNOSIS — E782 Mixed hyperlipidemia: Secondary | ICD-10-CM | POA: Diagnosis not present

## 2021-08-21 DIAGNOSIS — R7303 Prediabetes: Secondary | ICD-10-CM | POA: Diagnosis not present

## 2021-08-23 DIAGNOSIS — H353211 Exudative age-related macular degeneration, right eye, with active choroidal neovascularization: Secondary | ICD-10-CM | POA: Diagnosis not present

## 2021-08-23 DIAGNOSIS — H43813 Vitreous degeneration, bilateral: Secondary | ICD-10-CM | POA: Diagnosis not present

## 2021-08-23 DIAGNOSIS — H353121 Nonexudative age-related macular degeneration, left eye, early dry stage: Secondary | ICD-10-CM | POA: Diagnosis not present

## 2021-08-28 ENCOUNTER — Other Ambulatory Visit: Payer: Self-pay | Admitting: Urology

## 2021-08-28 DIAGNOSIS — C61 Malignant neoplasm of prostate: Secondary | ICD-10-CM | POA: Diagnosis not present

## 2021-08-28 DIAGNOSIS — I10 Essential (primary) hypertension: Secondary | ICD-10-CM | POA: Diagnosis not present

## 2021-08-28 DIAGNOSIS — R634 Abnormal weight loss: Secondary | ICD-10-CM | POA: Diagnosis not present

## 2021-08-28 DIAGNOSIS — R7303 Prediabetes: Secondary | ICD-10-CM | POA: Diagnosis not present

## 2021-08-28 DIAGNOSIS — N1831 Chronic kidney disease, stage 3a: Secondary | ICD-10-CM | POA: Diagnosis not present

## 2021-08-28 DIAGNOSIS — R3129 Other microscopic hematuria: Secondary | ICD-10-CM | POA: Diagnosis not present

## 2021-08-28 DIAGNOSIS — E782 Mixed hyperlipidemia: Secondary | ICD-10-CM | POA: Diagnosis not present

## 2021-08-28 DIAGNOSIS — M1A0790 Idiopathic chronic gout, unspecified ankle and foot, without tophus (tophi): Secondary | ICD-10-CM | POA: Diagnosis not present

## 2021-09-03 NOTE — Patient Instructions (Signed)
DUE TO COVID-19 ONLY TWO VISITORS  (aged 78 and older)  ARE ALLOWED TO COME WITH YOU AND STAY IN THE WAITING ROOM ONLY DURING PRE OP AND PROCEDURE.   **NO VISITORS ARE ALLOWED IN THE SHORT STAY AREA OR RECOVERY ROOM!!**  IF YOU WILL BE ADMITTED INTO THE HOSPITAL YOU ARE ALLOWED ONLY FOUR SUPPORT PEOPLE DURING VISITATION HOURS ONLY (7 AM -8PM)   The support person(s) must pass our screening, gel in and out, and wear a mask at all times, including in the patient's room. Patients must also wear a mask when staff or their support person are in the room. Visitors GUEST BADGE MUST BE WORN VISIBLY  One adult visitor may remain with you overnight and MUST be in the room by 8 P.M.     Your procedure is scheduled on: 09/12/21   Report to [facility] Main Entrance    Report to admitting at : 7:15 AM   Call this number if you have problems the morning of surgery [phone]   Do not eat food :After Midnight.   After Midnight you may have the following liquids until : 6:30 AM DAY OF SURGERY  Water Black Coffee (sugar ok, NO MILK/CREAM OR CREAMERS)  Tea (sugar ok, NO MILK/CREAM OR CREAMERS) regular and decaf                             Plain Jell-O (NO RED)                                           Fruit ices (not with fruit pulp, NO RED)                                     Popsicles (NO RED)                                                                  Juice: apple, WHITE grape, WHITE cranberry Sports drinks like Gatorade (NO RED) Clear broth(vegetable,chicken,beef)    Oral Hygiene is also important to reduce your risk of infection.                                    Remember - BRUSH YOUR TEETH THE MORNING OF SURGERY WITH YOUR REGULAR TOOTHPASTE   Do NOT smoke after Midnight   Take these medicines the morning of surgery with A SIP OF WATER: propanolol.Use eye drops as usual.Tylenol and cetirizine as needed.  DO NOT TAKE ANY ORAL DIABETIC MEDICATIONS DAY OF YOUR SURGERY  Bring  CPAP mask and tubing day of surgery.                              You may not have any metal on your body including hair pins, jewelry, and body piercing             Do not wear lotions, powders, perfumes/cologne, or  deodorant              Men may shave face and neck.   Do not bring valuables to the hospital. [place].   Contacts, dentures or bridgework may not be worn into surgery.   Bring small overnight bag day of surgery.   DO NOT [place]. PHARMACY WILL DISPENSE MEDICATIONS LISTED ON YOUR MEDICATION LIST TO YOU DURING YOUR ADMISSION [place]!    Patients discharged on the day of surgery will not be allowed to drive home.  Someone NEEDS to stay with you for the first 24 hours after anesthesia.   Special Instructions: Bring a copy of your healthcare power of attorney and living will documents         the day of surgery if you haven't scanned them before.              Please read over the following fact sheets you were given: IF YOU HAVE QUESTIONS ABOUT YOUR PRE-OP INSTRUCTIONS PLEASE CALL [phone]     [facility] - Preparing for Surgery Before surgery, you can play an important role.  Because skin is not sterile, your skin needs to be as free of germs as possible.  You can reduce the number of germs on your skin by washing with CHG (chlorahexidine gluconate) soap before surgery.  CHG is an antiseptic cleaner which kills germs and bonds with the skin to continue killing germs even after washing. Please DO NOT use if you have an allergy to CHG or antibacterial soaps.  If your skin becomes reddened/irritated stop using the CHG and inform your nurse when you arrive at Short Stay. Do not shave (including legs and underarms) for at least 48 hours prior to the first CHG shower.  You may shave your face/neck. Please follow these instructions carefully:  1.  Shower with CHG Soap the night before  surgery and the  morning of Surgery.  2.  If you choose to wash your hair, wash your hair first as usual with your  normal  shampoo.  3.  After you shampoo, rinse your hair and body thoroughly to remove the  shampoo.                           4.  Use CHG as you would any other liquid soap.  You can apply chg directly  to the skin and wash                       Gently with a scrungie or clean washcloth.  5.  Apply the CHG Soap to your body ONLY FROM THE NECK DOWN.   Do not use on face/ open                           Wound or open sores. Avoid contact with eyes, ears mouth and genitals (private parts).                       Wash face,  Genitals (private parts) with your normal soap.             6.  Wash thoroughly, paying special attention to the area where your surgery  will be performed.  7.  Thoroughly rinse your body with warm water from the neck down.  8.  DO NOT shower/wash with your normal soap after using and rinsing off  the CHG Soap.                9.  Pat yourself dry with a clean towel.            10.  Wear clean pajamas.            11.  Place clean sheets on your bed the night of your first shower and do not  sleep with pets. Day of Surgery : Do not apply any lotions/deodorants the morning of surgery.  Please wear clean clothes to the hospital/surgery center.  FAILURE TO FOLLOW THESE INSTRUCTIONS MAY RESULT IN THE CANCELLATION OF YOUR SURGERY PATIENT SIGNATURE_________________________________  NURSE SIGNATURE__________________________________  ________________________________________________________________________

## 2021-09-04 ENCOUNTER — Encounter (HOSPITAL_COMMUNITY): Payer: Self-pay

## 2021-09-04 ENCOUNTER — Other Ambulatory Visit: Payer: Self-pay

## 2021-09-04 ENCOUNTER — Encounter (HOSPITAL_COMMUNITY)
Admission: RE | Admit: 2021-09-04 | Discharge: 2021-09-04 | Disposition: A | Discharge status: Home / Self Care | Payer: Medicare Other | Source: Ambulatory Visit | Attending: Urology | Admitting: Urology

## 2021-09-04 VITALS — BP 133/68 | HR 80 | Temp 98.3°F | Resp 18 | Ht 69.5 in | Wt 178.2 lb

## 2021-09-04 DIAGNOSIS — I2510 Atherosclerotic heart disease of native coronary artery without angina pectoris: Secondary | ICD-10-CM | POA: Insufficient documentation

## 2021-09-04 DIAGNOSIS — Z01818 Encounter for other preprocedural examination: Secondary | ICD-10-CM | POA: Insufficient documentation

## 2021-09-04 HISTORY — DX: Unspecified macular degeneration: H35.30

## 2021-09-04 LAB — BASIC METABOLIC PANEL
Anion gap: 7 (ref 5–15)
BUN: 26 mg/dL — ABNORMAL HIGH (ref 8–23)
CO2: 28 mmol/L (ref 22–32)
Calcium: 10.5 mg/dL — ABNORMAL HIGH (ref 8.9–10.3)
Chloride: 110 mmol/L (ref 98–111)
Creatinine, Ser: 1.24 mg/dL (ref 0.61–1.24)
GFR, Estimated: 60 mL/min — ABNORMAL LOW (ref >60)
Glucose, Bld: 109 mg/dL — ABNORMAL HIGH (ref 70–99)
Potassium: 5.9 mmol/L — ABNORMAL HIGH (ref 3.5–5.1)
Sodium: 145 mmol/L (ref 135–145)

## 2021-09-04 LAB — CBC
HCT: 41.8 % (ref 39.0–52.0)
Hemoglobin: 13 g/dL (ref 13.0–17.0)
MCH: 27.3 pg (ref 26.0–34.0)
MCHC: 31.1 g/dL (ref 30.0–36.0)
MCV: 87.6 fL (ref 80.0–100.0)
Platelets: 209 10*3/uL (ref 150–400)
RBC: 4.77 MIL/uL (ref 4.22–5.81)
RDW: 18.2 % — ABNORMAL HIGH (ref 11.5–15.5)
WBC: 10 10*3/uL (ref 4.0–10.5)
nRBC: 0 % (ref 0.0–0.2)

## 2021-09-04 NOTE — Progress Notes (Signed)
Lab. Results: Potassium: 5.9

## 2021-09-04 NOTE — Progress Notes (Signed)
For Short Stay: [place] appointment date: Date of COVID positive in last 94 days:  Bowel Prep reminder:   For Anesthesia: PCP - Dr. Merrilee Seashore Cardiologist -   Chest x-ray -  EKG -  Stress Test -  ECHO -  Cardiac Cath -  Pacemaker/ICD device last checked: Pacemaker orders received: Device Rep notified:  Spinal Cord Stimulator:  Sleep Study -  CPAP -   Fasting Blood Sugar -  Checks Blood Sugar _____ times a day Date and result of last Hgb A1c-  Blood Thinner Instructions: Dr. Alinda Money Aspirin Instructions: On hold Last Dose:  Activity level: Can go up a flight of stairs and activities of daily living without stopping and without chest pain and/or shortness of breath   Able to exercise without chest pain and/or shortness of breath   Unable to go up a flight of stairs without chest pain and/or shortness of breath     Anesthesia review: Hx: HTN,Smoker  Patient denies shortness of breath, fever, cough and chest pain at PAT appointment   Patient verbalized understanding of instructions that were given to them at the PAT appointment. Patient was also instructed that they will need to review over the PAT instructions again at home before surgery.

## 2021-09-07 ENCOUNTER — Emergency Department (HOSPITAL_COMMUNITY)
Admission: EM | Admit: 2021-09-07 | Discharge: 2021-09-07 | Disposition: A | Discharge status: Home / Self Care | Payer: Medicare Other | Attending: Emergency Medicine | Admitting: Emergency Medicine

## 2021-09-07 ENCOUNTER — Encounter (HOSPITAL_COMMUNITY): Payer: Self-pay

## 2021-09-07 ENCOUNTER — Other Ambulatory Visit: Payer: Self-pay

## 2021-09-07 DIAGNOSIS — N3000 Acute cystitis without hematuria: Secondary | ICD-10-CM | POA: Diagnosis not present

## 2021-09-07 DIAGNOSIS — T83098A Other mechanical complication of other indwelling urethral catheter, initial encounter: Secondary | ICD-10-CM | POA: Diagnosis not present

## 2021-09-07 DIAGNOSIS — Z8546 Personal history of malignant neoplasm of prostate: Secondary | ICD-10-CM | POA: Diagnosis not present

## 2021-09-07 DIAGNOSIS — Z7982 Long term (current) use of aspirin: Secondary | ICD-10-CM | POA: Diagnosis not present

## 2021-09-07 DIAGNOSIS — T83091A Other mechanical complication of indwelling urethral catheter, initial encounter: Secondary | ICD-10-CM | POA: Diagnosis not present

## 2021-09-07 LAB — URINALYSIS, ROUTINE W REFLEX MICROSCOPIC
Bilirubin Urine: NEGATIVE
Glucose, UA: NEGATIVE mg/dL
Ketones, ur: NEGATIVE mg/dL
Nitrite: POSITIVE — AB
Protein, ur: 100 mg/dL — AB
RBC / HPF: >50 RBC/hpf — ABNORMAL HIGH (ref 0–5)
Specific Gravity, Urine: 1.015 (ref 1.005–1.030)
WBC, UA: >50 WBC/hpf — ABNORMAL HIGH (ref 0–5)
pH: 5 (ref 5.0–8.0)

## 2021-09-07 MED ORDER — CIPROFLOXACIN HCL 500 MG PO TABS: 500.0000 mg | ORAL_TABLET | Freq: Two times a day (BID) | ORAL | 0 refills | Status: AC

## 2021-09-07 NOTE — ED Provider Notes (Signed)
[facility]-EMERGENCY DEPT Provider Note   CSN: [id number] Arrival date & time: 09/07/21  4742     History  Chief Complaint  Patient presents with   Urinary Retention    Dominic Kelly is a 76 y.o. male.  Patient with a history of a chronic indwelling foley catheter from radiation after prostate cancer presents today with complaints of urinary catheter obstruction. States that last night he noticed that his catheter was not draining. Woke up this morning with lower abdominal discomfort and pressure. States that he has had a catheter in place for several months now.         Home Medications Prior to Admission medications   Medication Sig Start Date End Date Taking? Authorizing Provider  acetaminophen (TYLENOL) 500 MG tablet Take 1,000 mg by mouth every 6 (six) hours as needed for moderate pain.    [provider]  allopurinol (ZYLOPRIM) 100 MG tablet Take 100 mg by mouth every Monday, Wednesday, and Friday.    [provider]  aspirin 81 MG tablet Take 81 mg by mouth daily.    [provider]  atorvastatin (LIPITOR) 20 MG tablet Take 20 mg by mouth daily.    [provider]  carboxymethylcellulose (REFRESH TEARS) 0.5 % SOLN Place 1 drop into the right eye See admin instructions. Instill 1 drop 4 times in the right eye the day before, one day of, and one day after for eye injection every 10 weeks, instill 5 -10 minutes after each dose of Tobramycin eye drops    [provider]  cetirizine (ZYRTEC) 10 MG tablet Take 10 mg by mouth daily as needed for allergies.    [provider]  ciprofloxacin (CIPRO) 500 MG tablet Take 1 tablet (500 mg total) by mouth every 12 (twelve) hours. Patient not taking: Reported on 08/30/2021 07/13/21   Arthor Captain, PA-C  Emollient (CETAPHIL) cream Apply 1 Application topically daily. After taking a shower    [provider]  guaifenesin (HUMIBID E) 400 MG TABS tablet  Take 400 mg by mouth daily as needed (congestion).    [provider]  Multiple Vitamin (MULTIVITAMIN) capsule Take 1 capsule by mouth daily.    [provider]  Multiple Vitamins-Minerals (PRESERVISION AREDS 2) CAPS Take 1 capsule by mouth 2 (two) times daily.    [provider]  Omega-3 Fatty Acids (FISH OIL) 1200 MG CAPS Take 2,400 mg by mouth daily.    [provider]  Probiotic Product (PROBIOTIC-10 PO) Take 1 capsule by mouth 2 (two) times daily.    [provider]  propranolol (INDERAL) 10 MG tablet Take 10 mg by mouth 2 (two) times daily.    [provider]  Sennosides (SM SENNA LAXATIVE MAX ST) 25 MG TABS Take 25 mg by mouth at bedtime as needed (constipation).    [provider]  silodosin (RAPAFLO) 8 MG CAPS capsule Take 8 mg by mouth daily.    [provider]  SUPER B COMPLEX/C PO Take by mouth.    [provider]  telmisartan (MICARDIS) 40 MG tablet Take 40 mg by mouth daily.    [provider]  tobramycin (TOBREX) 0.3 % ophthalmic solution Place 1 drop into the right eye See admin instructions. Instill 1 drop 4 times in the right eye the day before, one day of, and one day after for eye injection every 10 weeks 05/23/21   [provider]  VITAMIN D PO Take 1 capsule by  mouth daily.    [provider]      Allergies    Lobster [shellfish allergy] and Tamsulosin    Review of Systems   Review of Systems  All other systems reviewed and are negative.   Physical Exam Updated Vital Signs BP (!) 150/61 (BP Location: Right Arm)   Pulse 78   Temp 97.9 F (36.6 C) (Oral)   Resp 16   Ht 5' 9.5" (1.765 m)   Wt 80.7 kg   SpO2 97%   BMI 25.91 kg/m  Physical Exam Vitals and nursing note reviewed.  Constitutional:      General: He is not in acute distress.    Appearance: Normal appearance. He is normal weight. He is not ill-appearing, toxic-appearing or diaphoretic.  HENT:      Head: Normocephalic and atraumatic.  Cardiovascular:     Rate and Rhythm: Normal rate.  Pulmonary:     Effort: Pulmonary effort is normal. No respiratory distress.  Abdominal:     General: Abdomen is flat.     Palpations: Abdomen is soft.     Tenderness: There is no abdominal tenderness.     Comments: Foley catheter in place without infectious signs. No urine present in leg pain.   Musculoskeletal:        General: Normal range of motion.     Cervical back: Normal range of motion.  Skin:    General: Skin is warm and dry.  Neurological:     General: No focal deficit present.     Mental Status: He is alert.  Psychiatric:        Mood and Affect: Mood normal.        Behavior: Behavior normal.     ED Results / Procedures / Treatments   Labs (all labs ordered are listed, but only abnormal results are displayed) Labs Reviewed  URINALYSIS, ROUTINE W REFLEX MICROSCOPIC - Abnormal; Notable for the following components:      Result Value   APPearance TURBID (*)    Hgb urine dipstick LARGE (*)    Protein, ur 100 (*)    Nitrite POSITIVE (*)    Leukocytes,Ua LARGE (*)    RBC / HPF >50 (*)    WBC, UA >50 (*)    Bacteria, UA FEW (*)    All other components within normal limits    EKG None  Radiology No results found.  Procedures Procedures    Medications Ordered in ED Medications - No data to display  ED Course/ Medical Decision Making/ A&P                           Medical Decision Making Amount and/or Complexity of Data Reviewed Labs: ordered.   Patient presents today with catheter obstruction.  He is afebrile, nontoxic-appearing, and in no acute distress with reassuring vital signs.  Initially attempted to flush out his catheter to resolve obstruction which was not successful.  Catheter was subsequently replaced without issue.  Catheter is currently flowing well and patient's pain is now completely resolved.  Urinalysis was performed from the replaced catheter and  does appear infected. Will give patient a prescription for cipro for same. Patient has a scheduled follow-up with his urologist on Thursday.  Patient is stable for discharge at this time, educated on red flag symptoms of prompt immediate return.  Discharged in stable condition.   Final Clinical Impression(s) / ED Diagnoses Final diagnoses:  Obstruction of Foley catheter, initial  encounter [facility])  Acute cystitis without hematuria    Rx / DC Orders ED Discharge Orders          Ordered    ciprofloxacin (CIPRO) 500 MG tablet  Every 12 hours        09/07/21 0932          An After Visit Summary was printed and given to the patient.     Vear Clock 09/07/21 4401    Linwood Dibbles, MD 09/07/21 1357

## 2021-09-11 NOTE — H&P (Signed)
Office Visit Report     08/16/2021   --------------------------------------------------------------------------------   Dominic Kelly  MRN: [id number]  DOB: Sep 20, 1945, 76 year old Male  SSN: [id number]   PRIMARY CARE:  Teodora Medici. Maudie Mercury [facility]), MD  REFERRING:  Georgette Dover, MD  PROVIDER:  [name], M.D.  LOCATION:  [facility] [phone]     --------------------------------------------------------------------------------   CC/HPI: 1. Prostate cancer  2. Urinary retention   Dominic Kelly returns today for further evaluation of his recurrent urinary retention episodes after having undergone a urodynamic evaluation. In addition, he returns for cystoscopy. He had undergone cystoscopy in January when he had presented with hematuria and was noted to have small bladder calculi and significant trabeculation of the bladder but had no urethral or bladder neck abnormalities noted at that time. He completed his radiation therapy in March 2022. Fortunately, he has had no concern for recurrent prostate cancer as his PSA remains basically undetectable in April.     ALLERGIES: No Allergies    MEDICATIONS: Aspirin  Cipro 500 mg tablet  Allopurinol 100 mg tablet Oral  Atorvastatin Calcium 20 MG Oral Tablet Oral  Fish Oil  Laxative  Losartan Potassium 25 mg tablet Oral  Melatonin  Multivitamin  Preservision Areds 2  Probiotic  Propranolol Hcl 10 mg tablet  Silodosin 8 mg capsule  Solifenacin Succinate 5 mg tablet 1 tablet PO Daily  Telmisartan 40 mg tablet  Vitamin B     GU PSH: Biopsy Skin Lesion - 2012 Complex cystometrogram, w/ void pressure and urethral pressure profile studies, any technique - 08/15/2021 Complex Uroflow - 08/15/2021 Cystoscopy - 03/20/2021 Emg surf Electrd - 08/15/2021 Inject For cystogram - 08/15/2021 Intrabd voidng Press - 08/15/2021 Locm 300-'399Mg'$ /Ml Iodine,1Ml - 03/13/2021 PLACE RT DEVICE/MARKER, PROS - 04/12/2020 Prostate  Needle Biopsy - 01/12/2020 Transperineal Plmt Biodegradable Matrl 1/Mlt Njx - 04/12/2020       PSH Notes: Ear Surgery, Biopsy Skin   NON-GU PSH: Surgical Pathology, Gross And Microscopic Examination For Prostate Needle - 01/12/2020     GU PMH: Urinary Retention - 08/15/2021, Foley replaced. He could consider CIC and Urolift down the road. , - 07/18/2021, - 07/17/2021, - 07/04/2021, - 07/03/2021, - 06/19/2021, - 06/07/2021 Prostate Cancer - 07/17/2021, - 03/20/2021, - 03/01/2021, - 01/18/2021, - 08/08/2020, - 04/12/2020, - 02/06/2020, - 01/27/2020 Phimosis - 07/04/2021, - 04/15/2021 Gross hematuria - 04/15/2021 BPH w/LUTS - 03/20/2021, - 03/01/2021, - 01/18/2021, - 08/08/2020, - 11/30/2019, - 2018 Microscopic hematuria - 03/20/2021 Nocturia - 03/20/2021, - 03/01/2021, - 01/18/2021, - 08/08/2020, - 11/30/2019, - 2019 Other microscopic hematuria - 03/13/2021, - 03/01/2021, Microscopic hematuria, - 2014 Elevated PSA - 01/12/2020, - 11/30/2019, - 2018, Elevated prostate specific antigen (PSA), - 2017      PMH Notes:   1) Microscopic hematuria: He had an isolated episode of microscopic hematuria in December 2011 which has not been persistent. He did smoke 2/3 pack of cigarettes for 20 years but quit in 2007. He has no family history of GU malignancy.   Feb 2012: NMP-22 negative   2) Prostate cancer: He was diagnosed with unfavorable intermediate risk prostate cancer in October 2021 with clinical stage T1c prostate cancer with a PSA of 5.49 and Gleason 4+3=7 adenocarcinoma. He is s/p treatment with IMRT (completed in March 2022) and 6 month of ADT.   Pretreatment PSA: 5.49  PSA nadir after treatment:  Pretreatment IPSS: 19  Pretreatment SHIM score: 21   3) BPH/LUTS:   Current treatment: Tamsulosin  0.4 mg (could not tolerate 0.8 mg), Solifenacin 5 mg (unable to get approved)  Prior treatment: Gemtesa (stopped due to palpitations and hypertension), Myrbetriq (made symptoms worse)   4) Hematuria: He developed gross  hematuria following his radiation.   Jan 2023: Cystoscopy - Small bladder calculi, severe trabeculation, CT imaging - Small nonobstructive left renal calculi, cytology with atypical cells     NON-GU PMH: GERD Hypercholesterolemia Hypertension    FAMILY HISTORY: Acute Myocardial Infarction - Father Lung Cancer - Mother   SOCIAL HISTORY: Marital Status: Single Preferred Language: English; Ethnicity: Not Hispanic Or Latino; Race: White Current Smoking Status: Patient does not smoke anymore.   Tobacco Use Assessment Completed: Used Tobacco in last 30 days? Does drink.  Drinks 1 caffeinated drink per day.     Notes: Former smoker, Marital History - Single, Tobacco Use, Alcohol Use   REVIEW OF SYSTEMS:    GU Review Male:   Patient denies frequent urination, hard to postpone urination, burning/ pain with urination, get up at night to urinate, leakage of urine, stream starts and stops, trouble starting your streams, and have to strain to urinate .  Gastrointestinal (Upper):   Patient denies nausea and vomiting.  Gastrointestinal (Lower):   Patient denies constipation and diarrhea.  Constitutional:   Patient denies fever, night sweats, weight loss, and fatigue.  Skin:   Patient denies skin rash/ lesion and itching.  Eyes:   Patient denies blurred vision and double vision.  Ears/ Nose/ Throat:   Patient denies sore throat and sinus problems.  Hematologic/Lymphatic:   Patient denies swollen glands and easy bruising.  Cardiovascular:   Patient denies leg swelling and chest pains.  Respiratory:   Patient denies cough and shortness of breath.  Endocrine:   Patient denies excessive thirst.  Musculoskeletal:   Patient denies back pain and joint pain.  Neurological:   Patient denies headaches and dizziness.  Psychologic:   Patient denies depression and anxiety.   VITAL SIGNS: None   GU PHYSICAL EXAMINATION:    Urethral Meatus: Normal size. No lesion, no wart, no discharge, no polyp. Normal  location.   MULTI-SYSTEM PHYSICAL EXAMINATION:    Constitutional: Well-nourished. No physical deformities. Normally developed. Good grooming.     Complexity of Data:  Records Review:   Previous Patient Records  Urodynamics Review:   Review Urodynamics Tests   07/10/21 01/11/21 08/01/20 11/30/19 11/23/18 05/14/18 04/29/17 11/12/16  PSA  Total PSA 0.021 ng/mL <0.015 ng/mL <0.015 ng/mL 5.49 ng/mL 3.76 ng/mL 4.09 ng/mL 3.40 ng/mL 4.00 ng/mL  Free PSA    1.02 ng/mL  0.69 ng/mL    % Free PSA    19 % PSA  17 % PSA     Notes:                     I reviewed his urodynamic evaluation which is not particularly helpful. Unfortunately, he was only able to be filled with approximately 107 cc which was quite uncomfortable for him. The bladder appeared to be stable. When he attempted voluntary void, he can only void a small amount of 16 cc with a maximum flow rate of 2 cc/s. Maximum detrusor pressure was 14 cmH2O.   PROCEDURES:         Flexible Cystoscopy - 52000  Indication: Urinary retention Risks, benefits, and potential complications of the procedure were discussed with the patient including infection, bleeding, voiding discomfort, urinary retention, fever, chills, sepsis, and others. All questions were answered. Informed consent was obtained.  Sterile technique and intraurethral analgesia were used.  Meatus:  Normal size. Normal location. Normal condition.  Urethra:  No strictures.  External Sphincter:  Normal.  Verumontanum:  Normal.  Prostate:  Non-obstructing. No hyperplasia.  Bladder Neck:  There was evidence of what appeared to be bladder neck stenosis with fibrosis of the bladder neck seen. This was technically open likely related to his prior catheter placement.  Ureteral Orifices:  Normal location. Normal size. Normal shape. Effluxed clear urine.  Bladder:  Inspection of the bladder revealed significant trabeculation but no bladder tumors or other significant mucosal pathology aside from  expected catheter edema.      Chaperone: SM The procedure was well-tolerated and without complications. Instructions were given to call the office immediately if questions or problems.   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          Document Letter(s):  Created for Patient: Clinical Summary         Notes:   1. Prostate cancer: He will keep his scheduled appointment for follow-up in November.   2. Urinary retention/bladder neck stenosis: He appears to have developed a bladder neck stenosis that may be recurrent and the reason why he has passed voiding trials in the office but then presents with subsequent retention. I recommended proceeding with cystoscopy and balloon dilation of his bladder neck stenosis under anesthesia. This will be scheduled for the near future. He will then present 1 week later for a voiding trial and also will have an appointment in 4 to 6 weeks with a midlevel provider with a PVR to assess his ongoing progress.           Next Appointment:      Next Appointment: 01/15/2022 08:45 AM    Appointment Type: Laboratory Appointment    Location: [facility] [phone]    Provider: Lab LAB    Reason for Visit: 6 mo psa      * Signed by [name], M.D. on 08/16/21 at 3:37 PM (EDT)*     APPENDED NOTES:    He apparently had to go to the emergency department and have his catheter changed. He did have a culture drawn that demonstrated both E. coli and Enterococcus. He was started on ciprofloxacin per the ER and I have now added amoxicillin to cover him for his Enterococcus. He will remain on these antibiotics leading up to his upcoming balloon dilation procedure.    * Signed by [name], M.D. on  09/09/21 at 3:47 PM (EDT)*

## 2021-09-11 NOTE — Anesthesia Preprocedure Evaluation (Signed)
Anesthesia Evaluation  Patient identified by MRN, date of birth, ID band Patient awake    Reviewed: Allergy & Precautions, NPO status   Airway Mallampati: II  TM Distance: >3 FB Neck ROM: Full    Dental no notable dental hx.    Pulmonary Current Smoker and Patient abstained from smoking.,    Pulmonary exam normal breath sounds clear to auscultation       Cardiovascular Exercise Tolerance: Good hypertension, Pt. on medications Normal cardiovascular exam Rhythm:Regular Rate:Normal     Neuro/Psych negative neurological ROS  negative psych ROS   GI/Hepatic Neg liver ROS, GERD  ,  Endo/Other  hyperlipidemia  Renal/GU negative Renal ROS  negative genitourinary   Musculoskeletal negative musculoskeletal ROS (+)   Abdominal   Peds negative pediatric ROS (+)  Hematology negative hematology ROS (+)   Anesthesia Other Findings Prostate cancer  Reproductive/Obstetrics negative OB ROS                            Anesthesia Physical Anesthesia Plan  ASA: 3  Anesthesia Plan: General   Post-op Pain Management: Tylenol PO (pre-op)*   Induction: Intravenous  PONV Risk Score and Plan: 1 and Treatment may vary due to age or medical condition  Airway Management Planned: LMA  Additional Equipment: None  Intra-op Plan:   Post-operative Plan: Extubation in OR  Informed Consent: I have reviewed the patients History and Physical, chart, labs and discussed the procedure including the risks, benefits and alternatives for the proposed anesthesia with the patient or authorized representative who has indicated his/her understanding and acceptance.       Plan Discussed with: Anesthesiologist, CRNA and Surgeon  Anesthesia Plan Comments: (Potassium elevated. Patient asymptomatic. New lab draw pending. Will proceed with surgery. Surgeon states procedure will be 10-15 min. Norton Blizzard, MD  )        Anesthesia Quick Evaluation

## 2021-09-12 ENCOUNTER — Ambulatory Visit (HOSPITAL_COMMUNITY)
Admission: RE | Admit: 2021-09-12 | Discharge: 2021-09-12 | Disposition: A | Discharge status: Home / Self Care | Payer: Medicare Other | Attending: Urology | Admitting: Urology

## 2021-09-12 ENCOUNTER — Ambulatory Visit (HOSPITAL_COMMUNITY): Payer: Medicare Other

## 2021-09-12 ENCOUNTER — Ambulatory Visit (HOSPITAL_BASED_OUTPATIENT_CLINIC_OR_DEPARTMENT_OTHER): Payer: Medicare Other | Admitting: Physician Assistant

## 2021-09-12 ENCOUNTER — Ambulatory Visit (HOSPITAL_COMMUNITY): Payer: Medicare Other | Admitting: Physician Assistant

## 2021-09-12 ENCOUNTER — Encounter (HOSPITAL_COMMUNITY)
Admission: RE | Disposition: A | Discharge status: Home / Self Care | Payer: Self-pay | Source: Home / Self Care | Attending: Urology

## 2021-09-12 ENCOUNTER — Encounter (HOSPITAL_COMMUNITY): Payer: Self-pay | Admitting: Urology

## 2021-09-12 DIAGNOSIS — N320 Bladder-neck obstruction: Secondary | ICD-10-CM | POA: Insufficient documentation

## 2021-09-12 DIAGNOSIS — E785 Hyperlipidemia, unspecified: Secondary | ICD-10-CM | POA: Insufficient documentation

## 2021-09-12 DIAGNOSIS — Z87891 Personal history of nicotine dependence: Secondary | ICD-10-CM | POA: Insufficient documentation

## 2021-09-12 DIAGNOSIS — Z8546 Personal history of malignant neoplasm of prostate: Secondary | ICD-10-CM | POA: Insufficient documentation

## 2021-09-12 DIAGNOSIS — K219 Gastro-esophageal reflux disease without esophagitis: Secondary | ICD-10-CM | POA: Diagnosis not present

## 2021-09-12 DIAGNOSIS — R339 Retention of urine, unspecified: Secondary | ICD-10-CM | POA: Diagnosis not present

## 2021-09-12 DIAGNOSIS — Z923 Personal history of irradiation: Secondary | ICD-10-CM | POA: Diagnosis not present

## 2021-09-12 DIAGNOSIS — F17200 Nicotine dependence, unspecified, uncomplicated: Secondary | ICD-10-CM | POA: Diagnosis not present

## 2021-09-12 DIAGNOSIS — F17210 Nicotine dependence, cigarettes, uncomplicated: Secondary | ICD-10-CM | POA: Diagnosis not present

## 2021-09-12 DIAGNOSIS — N210 Calculus in bladder: Secondary | ICD-10-CM | POA: Diagnosis not present

## 2021-09-12 DIAGNOSIS — I10 Essential (primary) hypertension: Secondary | ICD-10-CM | POA: Diagnosis not present

## 2021-09-12 DIAGNOSIS — R338 Other retention of urine: Secondary | ICD-10-CM | POA: Diagnosis not present

## 2021-09-12 DIAGNOSIS — Z79899 Other long term (current) drug therapy: Secondary | ICD-10-CM | POA: Insufficient documentation

## 2021-09-12 HISTORY — PX: CYSTOSCOPY WITH URETHRAL DILATATION: SHX5125

## 2021-09-12 LAB — BASIC METABOLIC PANEL
Anion gap: 5 (ref 5–15)
BUN: 17 mg/dL (ref 8–23)
CO2: 27 mmol/L (ref 22–32)
Calcium: 9.3 mg/dL (ref 8.9–10.3)
Chloride: 109 mmol/L (ref 98–111)
Creatinine, Ser: 0.98 mg/dL (ref 0.61–1.24)
GFR, Estimated: >60 mL/min (ref >60)
Glucose, Bld: 103 mg/dL — ABNORMAL HIGH (ref 70–99)
Potassium: 4.6 mmol/L (ref 3.5–5.1)
Sodium: 141 mmol/L (ref 135–145)

## 2021-09-12 SURGERY — CYSTOSCOPY, WITH URETHRAL DILATION
Anesthesia: General

## 2021-09-12 MED ORDER — DEXAMETHASONE SODIUM PHOSPHATE 10 MG/ML IJ SOLN
INTRAMUSCULAR | Status: AC
  Filled 2021-09-12: qty 1

## 2021-09-12 MED ORDER — DEXAMETHASONE SODIUM PHOSPHATE 4 MG/ML IJ SOLN
INTRAMUSCULAR | Status: DC | PRN
  Administered 2021-09-12: 10 mg via INTRAVENOUS

## 2021-09-12 MED ORDER — AMISULPRIDE (ANTIEMETIC) 5 MG/2ML IV SOLN: 10.0000 mg | Freq: Once | INTRAVENOUS | Status: DC | PRN

## 2021-09-12 MED ORDER — FENTANYL CITRATE (PF) 100 MCG/2ML IJ SOLN
INTRAMUSCULAR | Status: DC | PRN
Start: 2021-09-12 — End: 2021-09-12
  Administered 2021-09-12: 50 ug via INTRAVENOUS

## 2021-09-12 MED ORDER — FENTANYL CITRATE PF 50 MCG/ML IJ SOSY: 25.0000 ug | PREFILLED_SYRINGE | INTRAMUSCULAR | Status: DC | PRN

## 2021-09-12 MED ORDER — STERILE WATER FOR IRRIGATION IR SOLN
Status: DC | PRN
  Administered 2021-09-12: 3000 mL

## 2021-09-12 MED ORDER — FENTANYL CITRATE (PF) 100 MCG/2ML IJ SOLN
INTRAMUSCULAR | Status: AC
  Filled 2021-09-12: qty 2

## 2021-09-12 MED ORDER — SODIUM CHLORIDE 0.9 % IV SOLN
2.0000 g | Freq: Once | INTRAVENOUS | Status: AC
  Administered 2021-09-12: 2 g via INTRAVENOUS
  Filled 2021-09-12: qty 20

## 2021-09-12 MED ORDER — ACETAMINOPHEN 500 MG PO TABS
1000.0000 mg | ORAL_TABLET | Freq: Once | ORAL | Status: AC
  Administered 2021-09-12: 1000 mg via ORAL
  Filled 2021-09-12: qty 2

## 2021-09-12 MED ORDER — MIDAZOLAM HCL 5 MG/5ML IJ SOLN
INTRAMUSCULAR | Status: DC | PRN
  Administered 2021-09-12: 2 mg via INTRAVENOUS

## 2021-09-12 MED ORDER — ONDANSETRON HCL 4 MG/2ML IJ SOLN: 4.0000 mg | Freq: Once | INTRAMUSCULAR | Status: DC | PRN

## 2021-09-12 MED ORDER — OXYCODONE HCL 5 MG/5ML PO SOLN: 5.0000 mg | Freq: Once | ORAL | Status: DC | PRN

## 2021-09-12 MED ORDER — LACTATED RINGERS IV SOLN: INTRAVENOUS | Status: DC

## 2021-09-12 MED ORDER — OXYCODONE HCL 5 MG PO TABS: 5.0000 mg | ORAL_TABLET | Freq: Once | ORAL | Status: DC | PRN

## 2021-09-12 MED ORDER — PROPOFOL 10 MG/ML IV BOLUS
INTRAVENOUS | Status: AC
  Filled 2021-09-12: qty 20

## 2021-09-12 MED ORDER — STERILE WATER FOR IRRIGATION IR SOLN
Status: DC | PRN
  Administered 2021-09-12: 500 mL

## 2021-09-12 MED ORDER — ORAL CARE MOUTH RINSE: 15.0000 mL | Freq: Once | OROMUCOSAL | Status: AC

## 2021-09-12 MED ORDER — LIDOCAINE HCL (CARDIAC) PF 100 MG/5ML IV SOSY
PREFILLED_SYRINGE | INTRAVENOUS | Status: DC | PRN
  Administered 2021-09-12: 60 mg via INTRAVENOUS

## 2021-09-12 MED ORDER — CIPROFLOXACIN HCL 500 MG PO TABS: 500.0000 mg | ORAL_TABLET | Freq: Two times a day (BID) | ORAL | 0 refills | Status: AC

## 2021-09-12 MED ORDER — CHLORHEXIDINE GLUCONATE 0.12 % MT SOLN
15.0000 mL | Freq: Once | OROMUCOSAL | Status: AC
  Administered 2021-09-12: 15 mL via OROMUCOSAL

## 2021-09-12 MED ORDER — PROPOFOL 10 MG/ML IV BOLUS
INTRAVENOUS | Status: DC | PRN
  Administered 2021-09-12: 60 mg via INTRAVENOUS
  Administered 2021-09-12: 90 mg via INTRAVENOUS

## 2021-09-12 MED ORDER — IOHEXOL 300 MG/ML  SOLN
INTRAMUSCULAR | Status: DC | PRN
  Administered 2021-09-12: 10 mL

## 2021-09-12 MED ORDER — ONDANSETRON HCL 4 MG/2ML IJ SOLN
INTRAMUSCULAR | Status: DC | PRN
  Administered 2021-09-12: 4 mg via INTRAVENOUS

## 2021-09-12 MED ORDER — LIDOCAINE HCL (PF) 2 % IJ SOLN
INTRAMUSCULAR | Status: AC
  Filled 2021-09-12: qty 5

## 2021-09-12 MED ORDER — MIDAZOLAM HCL 2 MG/2ML IJ SOLN
INTRAMUSCULAR | Status: AC
  Filled 2021-09-12: qty 2

## 2021-09-12 MED ORDER — ONDANSETRON HCL 4 MG/2ML IJ SOLN
INTRAMUSCULAR | Status: AC
  Filled 2021-09-12: qty 2

## 2021-09-12 SURGICAL SUPPLY — 24 items
BAG URINE DRAIN 2000ML AR STRL (UROLOGICAL SUPPLIES) ×1 IMPLANT
BAG URINE LEG 500ML (DRAIN) ×1 IMPLANT
BALLN NEPHROSTOMY (BALLOONS) ×2
BALLOON NEPHROSTOMY (BALLOONS) IMPLANT
CATH FOLEY 2W COUNCIL 20FR 5CC (CATHETERS) IMPLANT
CATH FOLEY 2WAY SLVR  5CC 20FR (CATHETERS) ×2
CATH FOLEY 2WAY SLVR 5CC 20FR (CATHETERS) IMPLANT
CATH ROBINSON RED A/P 14FR (CATHETERS) IMPLANT
CATH URET 5FR 28IN CONE TIP (BALLOONS)
CATH URET 5FR 70CM CONE TIP (BALLOONS) IMPLANT
CATH URETL OPEN END 6FR 70 (CATHETERS) IMPLANT
CLOTH BEACON ORANGE TIMEOUT ST (SAFETY) ×2 IMPLANT
GLOVE BIO SURGEON STRL SZ7.5 (GLOVE) ×2 IMPLANT
GOWN STRL REUS W/ TWL XL LVL3 (GOWN DISPOSABLE) ×1 IMPLANT
GOWN STRL REUS W/TWL XL LVL3 (GOWN DISPOSABLE) ×2
GUIDEWIRE ANG ZIPWIRE 038X150 (WIRE) IMPLANT
GUIDEWIRE STR DUAL SENSOR (WIRE) ×1 IMPLANT
HOLDER FOLEY CATH W/STRAP (MISCELLANEOUS) ×1 IMPLANT
KIT TURNOVER KIT A (KITS) IMPLANT
MANIFOLD NEPTUNE II (INSTRUMENTS) IMPLANT
NS IRRIG 1000ML POUR BTL (IV SOLUTION) ×1 IMPLANT
PACK CYSTO (CUSTOM PROCEDURE TRAY) ×2 IMPLANT
PENCIL SMOKE EVACUATOR (MISCELLANEOUS) IMPLANT
WATER STERILE IRR 3000ML UROMA (IV SOLUTION) ×2 IMPLANT

## 2021-09-12 NOTE — Interval H&P Note (Signed)
History and Physical Interval Note:  09/12/2021 8:48 AM  Dominic Kelly  has presented today for surgery, with the diagnosis of URINARY RETENTION, BLADDER NECK STENOSIS.  The various methods of treatment have been discussed with the patient and family. After consideration of risks, benefits and other options for treatment, the patient has consented to  Procedure(s): [place] (N/A) as a surgical intervention.  The patient's history has been reviewed, patient examined, no change in status, stable for surgery.  I have reviewed the patient's chart and labs.  Questions were answered to the patient's satisfaction.     [employer]

## 2021-09-12 NOTE — Op Note (Signed)
Preoperative diagnosis: Bladder neck stenosis with urinary retention  Postoperative diagnosis: Bladder neck stenosis with urinary retention  Procedure: Cystoscopy with balloon dilation of bladder neck stenosis  Surgeon: Pryor Curia MD  Anesthesia: General  Complications: None  EBL: Minimal  Specimens: None  Indication: Mr. Dominic Kelly is a 76 year old gentleman with a history of prostate cancer status post primary radiation therapy for treatment.  He recently developed increased voiding difficulties and recurrent episodes of urinary retention after initially passing voiding trials.  Cystoscopy revealed evidence of bladder neck stenosis as the likely cause for his recurrent urinary retention episodes.  He does have a current indwelling Foley catheter.  After reviewing options, he elected to proceed with the above procedures.  The potential risks, complications, and the expected recovery process was discussed in detail.  Informed consent was obtained.  Description of procedure: The patient was taken the operating room and a general anesthetic was administered.  He was given preoperative antibiotics, placed in the dorsolithotomy position, and prepped and draped in usual sterile fashion.  He had been on appropriate culture specific antibiotics prior to the procedure.  A preoperative timeout was performed.  Cystourethroscopy was then performed.  The patient did have significant phimosis as previously noted.  This did not preclude cystoscopy.  The anterior urethra appeared normal.  Inspection of the prostatic urethra/bladder neck revealed evidence of bladder neck scarring/stenosis.  However, I was able to manipulate the 77 French rigid cystoscope into the bladder.  There were noted to be multiple calcifications within the bladder likely related to his indwelling catheter.  These were able to easily be removed/drained from the bladder.  Reinspection of the bladder revealed no further  abnormalities except for expected edema with his indwelling catheter.  A 0.38 sensor guidewire was then advanced into the bladder.  This was left in place and the cystoscope was removed.  The 24 French dilating balloon was then advanced over the wire under fluoroscopic guidance and across the bladder neck stenosis.  This was then inflated with Omnipaque contrast to a total of 16 atm of pressure.  This was left dilated for 5 minutes and then taken down and removed.  Reinspection with cystoscopy revealed the bladder neck to be widely patent.  A new 20 French catheter was inserted and left indwelling and placed to a leg bag.  The patient tolerated the procedure well without complications.  He was able to be awakened and transferred to recovery unit in satisfactory condition.

## 2021-09-12 NOTE — Transfer of Care (Signed)
Immediate Anesthesia Transfer of Care Note  Patient: Dominic Kelly  Procedure(s) Performed: CYSTOSCOPY WITH BALLOON URETHRAL DILATATION OF BLADDER NECK  Patient Location: PACU  Anesthesia Type:General  Level of Consciousness: awake  Airway & Oxygen Therapy: Patient Spontanous Breathing  Post-op Assessment: Report given to RN  Post vital signs: stable  Last Vitals:  Vitals Value Taken Time  BP 131/66 09/12/21 1010  Temp    Pulse 62 09/12/21 1011  Resp 11 09/12/21 1011  SpO2 100 % 09/12/21 1011  Vitals shown include unvalidated device data.  Last Pain:  Vitals:   09/12/21 0823  TempSrc:   PainSc: 0-No pain         Complications: No notable events documented.

## 2021-09-12 NOTE — Anesthesia Procedure Notes (Signed)
Procedure Name: LMA Insertion Date/Time: 09/12/2021 9:30 AM  Performed by: Erial Fikes, Forest Gleason, CRNAPre-anesthesia Checklist: Patient identified, Emergency Drugs available, Suction available, Patient being monitored and Timeout performed Patient Re-evaluated:Patient Re-evaluated prior to induction Oxygen Delivery Method: Circle system utilized Preoxygenation: Pre-oxygenation with 100% oxygen Induction Type: IV induction Ventilation: Mask ventilation without difficulty LMA: LMA with gastric port inserted LMA Size: 5.0 Number of attempts: 1 Dental Injury: Teeth and Oropharynx as per pre-operative assessment

## 2021-09-12 NOTE — Discharge Instructions (Signed)

## 2021-09-13 ENCOUNTER — Encounter (HOSPITAL_COMMUNITY): Payer: Self-pay | Admitting: Urology

## 2021-09-14 NOTE — Anesthesia Postprocedure Evaluation (Signed)
Anesthesia Post Note  Patient: Dominic Kelly  Procedure(s) Performed: CYSTOSCOPY WITH BALLOON URETHRAL DILATATION OF BLADDER NECK     Patient location during evaluation: PACU Anesthesia Type: General Level of consciousness: awake Pain management: pain level controlled Vital Signs Assessment: post-procedure vital signs reviewed and stable Respiratory status: spontaneous breathing and respiratory function stable Cardiovascular status: stable Postop Assessment: no apparent nausea or vomiting Anesthetic complications: no   No notable events documented.  Last Vitals:  Vitals:   09/12/21 1030 09/12/21 1036  BP: (!) 142/69 (!) 151/68  Pulse: 66 62  Resp: 15 15  Temp: (!) 36.4 C   SpO2: 97% 100%    Last Pain:  Vitals:   09/12/21 1036  TempSrc:   PainSc: 0-No pain   Pain Goal:                   Merlinda Frederick

## 2021-09-19 DIAGNOSIS — N320 Bladder-neck obstruction: Secondary | ICD-10-CM | POA: Diagnosis not present

## 2021-09-19 DIAGNOSIS — R338 Other retention of urine: Secondary | ICD-10-CM | POA: Diagnosis not present

## 2021-10-08 IMAGING — CT NM PET TUM IMG SKULL BASE T - THIGH
1 series · 1 of 1 positions shown · non-contrast
Comparison: Prostate MRI 01/05/2020

CLINICAL DATA: Prostate carcinoma with rising PSA equal 5.5.

EXAM:
NUCLEAR MEDICINE PET SKULL BASE TO THIGH
TECHNIQUE: 5.5 mCi F18 Piflufolastat (Pylarify) was injected intravenously.
Full-ring PET imaging was performed from the skull base to thigh
after the radiotracer. CT data was obtained and used for attenuation
correction and anatomic localization.

[Series 1095: results mm oncology reading · 4.0mm · 0.45mm/px · 1 of 1 slices shown]
[im 1/1]
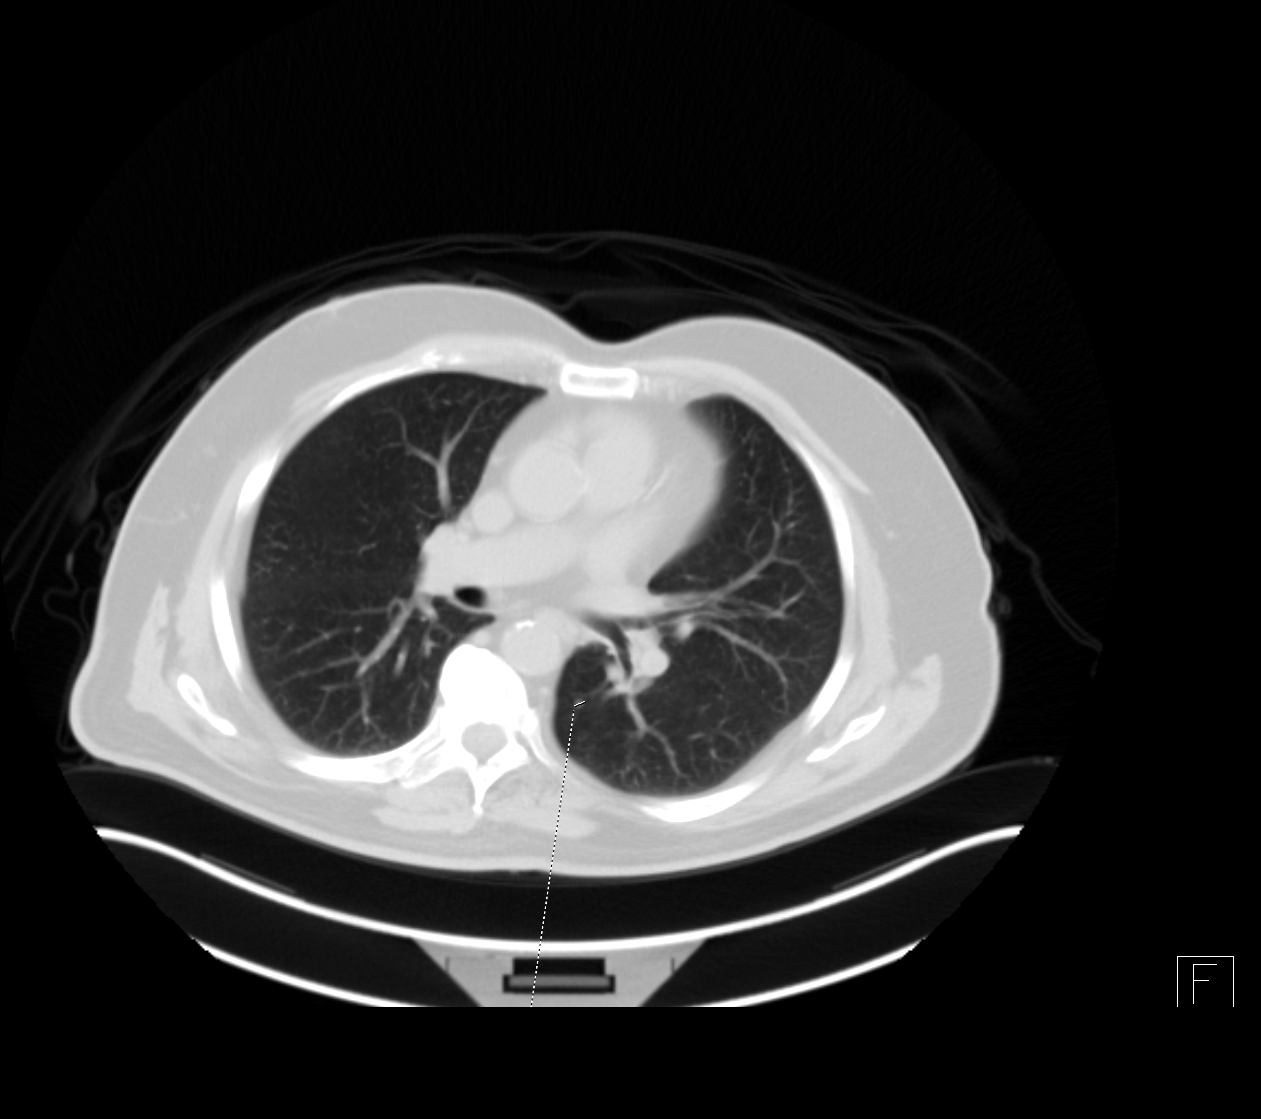

[1 of 1 positions shown; findings below may reference images not displayed]

FINDINGS: NECK

No radiotracer activity in neck lymph nodes.

Incidental CT finding: None

CHEST

No radiotracer accumulation within mediastinal or hilar lymph nodes.
No suspicious pulmonary nodules on the CT scan.

Incidental CT finding: Small pulmonary nodule in the LEFT lower lobe
(image 38/series 8).

ABDOMEN/PELVIS

Prostate: No focal activity within the large prostate gland.

Lymph nodes: No abnormal radiotracer accumulation within pelvic or
abdominal nodes.

Liver: No evidence of liver metastasis

Incidental CT finding: None

SKELETON

No focal  activity to suggest skeletal metastasis.
IMPRESSION: 1. No evidence of prostate cancer nodal metastasis, visceral
metastasis, or skeletal metastasis
2. Small LEFT lobe pulmonary nodule is favored benign. No follow-up
needed if patient is low-risk. Non-contrast chest CT can be
considered in 12 months if patient is high-risk. This recommendation
follows the consensus statement: Guidelines for Management of
Incidental Pulmonary Nodules Detected on CT Images: From the

## 2021-10-31 DIAGNOSIS — R338 Other retention of urine: Secondary | ICD-10-CM | POA: Diagnosis not present

## 2021-10-31 DIAGNOSIS — R351 Nocturia: Secondary | ICD-10-CM | POA: Diagnosis not present

## 2021-10-31 DIAGNOSIS — N320 Bladder-neck obstruction: Secondary | ICD-10-CM | POA: Diagnosis not present

## 2021-11-01 DIAGNOSIS — H353121 Nonexudative age-related macular degeneration, left eye, early dry stage: Secondary | ICD-10-CM | POA: Diagnosis not present

## 2021-11-01 DIAGNOSIS — H43813 Vitreous degeneration, bilateral: Secondary | ICD-10-CM | POA: Diagnosis not present

## 2021-11-01 DIAGNOSIS — H353211 Exudative age-related macular degeneration, right eye, with active choroidal neovascularization: Secondary | ICD-10-CM | POA: Diagnosis not present

## 2021-12-23 DIAGNOSIS — L821 Other seborrheic keratosis: Secondary | ICD-10-CM | POA: Diagnosis not present

## 2021-12-23 DIAGNOSIS — D225 Melanocytic nevi of trunk: Secondary | ICD-10-CM | POA: Diagnosis not present

## 2021-12-23 DIAGNOSIS — Z85828 Personal history of other malignant neoplasm of skin: Secondary | ICD-10-CM | POA: Diagnosis not present

## 2021-12-23 DIAGNOSIS — L570 Actinic keratosis: Secondary | ICD-10-CM | POA: Diagnosis not present

## 2021-12-23 DIAGNOSIS — L814 Other melanin hyperpigmentation: Secondary | ICD-10-CM | POA: Diagnosis not present

## 2021-12-23 DIAGNOSIS — Z86007 Personal history of in-situ neoplasm of skin: Secondary | ICD-10-CM | POA: Diagnosis not present

## 2021-12-23 DIAGNOSIS — Z08 Encounter for follow-up examination after completed treatment for malignant neoplasm: Secondary | ICD-10-CM | POA: Diagnosis not present

## 2022-01-15 DIAGNOSIS — C61 Malignant neoplasm of prostate: Secondary | ICD-10-CM | POA: Diagnosis not present

## 2022-01-20 DIAGNOSIS — H43813 Vitreous degeneration, bilateral: Secondary | ICD-10-CM | POA: Diagnosis not present

## 2022-01-20 DIAGNOSIS — H353211 Exudative age-related macular degeneration, right eye, with active choroidal neovascularization: Secondary | ICD-10-CM | POA: Diagnosis not present

## 2022-01-20 DIAGNOSIS — H353121 Nonexudative age-related macular degeneration, left eye, early dry stage: Secondary | ICD-10-CM | POA: Diagnosis not present

## 2022-01-22 DIAGNOSIS — N320 Bladder-neck obstruction: Secondary | ICD-10-CM | POA: Diagnosis not present

## 2022-01-22 DIAGNOSIS — C61 Malignant neoplasm of prostate: Secondary | ICD-10-CM | POA: Diagnosis not present

## 2022-01-22 DIAGNOSIS — R8271 Bacteriuria: Secondary | ICD-10-CM | POA: Diagnosis not present

## 2022-02-14 DIAGNOSIS — I10 Essential (primary) hypertension: Secondary | ICD-10-CM | POA: Diagnosis not present

## 2022-02-14 DIAGNOSIS — R5383 Other fatigue: Secondary | ICD-10-CM | POA: Diagnosis not present

## 2022-02-14 DIAGNOSIS — E782 Mixed hyperlipidemia: Secondary | ICD-10-CM | POA: Diagnosis not present

## 2022-02-14 DIAGNOSIS — R7303 Prediabetes: Secondary | ICD-10-CM | POA: Diagnosis not present

## 2022-02-14 DIAGNOSIS — M1A0790 Idiopathic chronic gout, unspecified ankle and foot, without tophus (tophi): Secondary | ICD-10-CM | POA: Diagnosis not present

## 2022-02-14 DIAGNOSIS — R3129 Other microscopic hematuria: Secondary | ICD-10-CM | POA: Diagnosis not present

## 2022-02-14 DIAGNOSIS — N1831 Chronic kidney disease, stage 3a: Secondary | ICD-10-CM | POA: Diagnosis not present

## 2022-02-20 DIAGNOSIS — N1831 Chronic kidney disease, stage 3a: Secondary | ICD-10-CM | POA: Diagnosis not present

## 2022-02-20 DIAGNOSIS — R7303 Prediabetes: Secondary | ICD-10-CM | POA: Diagnosis not present

## 2022-02-20 DIAGNOSIS — I10 Essential (primary) hypertension: Secondary | ICD-10-CM | POA: Diagnosis not present

## 2022-02-20 DIAGNOSIS — E782 Mixed hyperlipidemia: Secondary | ICD-10-CM | POA: Diagnosis not present

## 2022-02-20 DIAGNOSIS — M1A0790 Idiopathic chronic gout, unspecified ankle and foot, without tophus (tophi): Secondary | ICD-10-CM | POA: Diagnosis not present

## 2022-02-20 DIAGNOSIS — Z0000 Encounter for general adult medical examination without abnormal findings: Secondary | ICD-10-CM | POA: Diagnosis not present

## 2022-02-20 DIAGNOSIS — R3129 Other microscopic hematuria: Secondary | ICD-10-CM | POA: Diagnosis not present

## 2022-02-20 DIAGNOSIS — Z23 Encounter for immunization: Secondary | ICD-10-CM | POA: Diagnosis not present

## 2022-02-20 DIAGNOSIS — R5383 Other fatigue: Secondary | ICD-10-CM | POA: Diagnosis not present

## 2022-02-27 DIAGNOSIS — Z23 Encounter for immunization: Secondary | ICD-10-CM | POA: Diagnosis not present

## 2022-03-24 DIAGNOSIS — H5202 Hypermetropia, left eye: Secondary | ICD-10-CM | POA: Diagnosis not present

## 2022-03-24 DIAGNOSIS — H353211 Exudative age-related macular degeneration, right eye, with active choroidal neovascularization: Secondary | ICD-10-CM | POA: Diagnosis not present

## 2022-03-24 DIAGNOSIS — Z961 Presence of intraocular lens: Secondary | ICD-10-CM | POA: Diagnosis not present

## 2022-03-24 DIAGNOSIS — H5211 Myopia, right eye: Secondary | ICD-10-CM | POA: Diagnosis not present

## 2022-04-01 DIAGNOSIS — H353121 Nonexudative age-related macular degeneration, left eye, early dry stage: Secondary | ICD-10-CM | POA: Diagnosis not present

## 2022-04-01 DIAGNOSIS — H43813 Vitreous degeneration, bilateral: Secondary | ICD-10-CM | POA: Diagnosis not present

## 2022-04-01 DIAGNOSIS — H353211 Exudative age-related macular degeneration, right eye, with active choroidal neovascularization: Secondary | ICD-10-CM | POA: Diagnosis not present

## 2022-06-10 DIAGNOSIS — H353211 Exudative age-related macular degeneration, right eye, with active choroidal neovascularization: Secondary | ICD-10-CM | POA: Diagnosis not present

## 2022-06-10 DIAGNOSIS — H353121 Nonexudative age-related macular degeneration, left eye, early dry stage: Secondary | ICD-10-CM | POA: Diagnosis not present

## 2022-06-10 DIAGNOSIS — H43813 Vitreous degeneration, bilateral: Secondary | ICD-10-CM | POA: Diagnosis not present

## 2022-07-01 DIAGNOSIS — C44729 Squamous cell carcinoma of skin of left lower limb, including hip: Secondary | ICD-10-CM | POA: Diagnosis not present

## 2022-07-01 DIAGNOSIS — D0472 Carcinoma in situ of skin of left lower limb, including hip: Secondary | ICD-10-CM | POA: Diagnosis not present

## 2022-07-01 DIAGNOSIS — Z86007 Personal history of in-situ neoplasm of skin: Secondary | ICD-10-CM | POA: Diagnosis not present

## 2022-07-01 DIAGNOSIS — L821 Other seborrheic keratosis: Secondary | ICD-10-CM | POA: Diagnosis not present

## 2022-07-01 DIAGNOSIS — Z08 Encounter for follow-up examination after completed treatment for malignant neoplasm: Secondary | ICD-10-CM | POA: Diagnosis not present

## 2022-07-01 DIAGNOSIS — Z85828 Personal history of other malignant neoplasm of skin: Secondary | ICD-10-CM | POA: Diagnosis not present

## 2022-07-01 DIAGNOSIS — L814 Other melanin hyperpigmentation: Secondary | ICD-10-CM | POA: Diagnosis not present

## 2022-07-01 DIAGNOSIS — D225 Melanocytic nevi of trunk: Secondary | ICD-10-CM | POA: Diagnosis not present

## 2022-07-01 DIAGNOSIS — L570 Actinic keratosis: Secondary | ICD-10-CM | POA: Diagnosis not present

## 2022-07-30 DIAGNOSIS — C61 Malignant neoplasm of prostate: Secondary | ICD-10-CM | POA: Diagnosis not present

## 2022-08-06 DIAGNOSIS — N320 Bladder-neck obstruction: Secondary | ICD-10-CM | POA: Diagnosis not present

## 2022-08-06 DIAGNOSIS — N3941 Urge incontinence: Secondary | ICD-10-CM | POA: Diagnosis not present

## 2022-08-06 DIAGNOSIS — C61 Malignant neoplasm of prostate: Secondary | ICD-10-CM | POA: Diagnosis not present

## 2022-08-06 DIAGNOSIS — N401 Enlarged prostate with lower urinary tract symptoms: Secondary | ICD-10-CM | POA: Diagnosis not present

## 2022-08-20 DIAGNOSIS — H353211 Exudative age-related macular degeneration, right eye, with active choroidal neovascularization: Secondary | ICD-10-CM | POA: Diagnosis not present

## 2022-08-20 DIAGNOSIS — N1831 Chronic kidney disease, stage 3a: Secondary | ICD-10-CM | POA: Diagnosis not present

## 2022-08-20 DIAGNOSIS — R7303 Prediabetes: Secondary | ICD-10-CM | POA: Diagnosis not present

## 2022-08-20 DIAGNOSIS — I10 Essential (primary) hypertension: Secondary | ICD-10-CM | POA: Diagnosis not present

## 2022-08-20 DIAGNOSIS — H43813 Vitreous degeneration, bilateral: Secondary | ICD-10-CM | POA: Diagnosis not present

## 2022-08-20 DIAGNOSIS — M1A0790 Idiopathic chronic gout, unspecified ankle and foot, without tophus (tophi): Secondary | ICD-10-CM | POA: Diagnosis not present

## 2022-08-20 DIAGNOSIS — E782 Mixed hyperlipidemia: Secondary | ICD-10-CM | POA: Diagnosis not present

## 2022-08-20 DIAGNOSIS — H353121 Nonexudative age-related macular degeneration, left eye, early dry stage: Secondary | ICD-10-CM | POA: Diagnosis not present

## 2022-08-27 DIAGNOSIS — R5383 Other fatigue: Secondary | ICD-10-CM | POA: Diagnosis not present

## 2022-08-27 DIAGNOSIS — I10 Essential (primary) hypertension: Secondary | ICD-10-CM | POA: Diagnosis not present

## 2022-08-27 DIAGNOSIS — R7303 Prediabetes: Secondary | ICD-10-CM | POA: Diagnosis not present

## 2022-08-27 DIAGNOSIS — N1831 Chronic kidney disease, stage 3a: Secondary | ICD-10-CM | POA: Diagnosis not present

## 2022-08-27 DIAGNOSIS — R3129 Other microscopic hematuria: Secondary | ICD-10-CM | POA: Diagnosis not present

## 2022-08-27 DIAGNOSIS — E782 Mixed hyperlipidemia: Secondary | ICD-10-CM | POA: Diagnosis not present

## 2022-08-27 DIAGNOSIS — M1A0790 Idiopathic chronic gout, unspecified ankle and foot, without tophus (tophi): Secondary | ICD-10-CM | POA: Diagnosis not present

## 2022-09-03 DIAGNOSIS — L308 Other specified dermatitis: Secondary | ICD-10-CM | POA: Diagnosis not present

## 2022-10-15 DIAGNOSIS — L308 Other specified dermatitis: Secondary | ICD-10-CM | POA: Diagnosis not present

## 2022-10-15 DIAGNOSIS — D0472 Carcinoma in situ of skin of left lower limb, including hip: Secondary | ICD-10-CM | POA: Diagnosis not present

## 2022-10-15 DIAGNOSIS — L578 Other skin changes due to chronic exposure to nonionizing radiation: Secondary | ICD-10-CM | POA: Diagnosis not present

## 2022-10-15 DIAGNOSIS — Z85828 Personal history of other malignant neoplasm of skin: Secondary | ICD-10-CM | POA: Diagnosis not present

## 2022-10-15 DIAGNOSIS — Z08 Encounter for follow-up examination after completed treatment for malignant neoplasm: Secondary | ICD-10-CM | POA: Diagnosis not present

## 2022-10-29 DIAGNOSIS — H353122 Nonexudative age-related macular degeneration, left eye, intermediate dry stage: Secondary | ICD-10-CM | POA: Diagnosis not present

## 2022-10-29 DIAGNOSIS — H43813 Vitreous degeneration, bilateral: Secondary | ICD-10-CM | POA: Diagnosis not present

## 2022-10-29 DIAGNOSIS — H353211 Exudative age-related macular degeneration, right eye, with active choroidal neovascularization: Secondary | ICD-10-CM | POA: Diagnosis not present

## 2022-11-26 DIAGNOSIS — L570 Actinic keratosis: Secondary | ICD-10-CM | POA: Diagnosis not present

## 2022-11-26 DIAGNOSIS — L578 Other skin changes due to chronic exposure to nonionizing radiation: Secondary | ICD-10-CM | POA: Diagnosis not present

## 2022-12-12 DIAGNOSIS — H353112 Nonexudative age-related macular degeneration, right eye, intermediate dry stage: Secondary | ICD-10-CM | POA: Diagnosis not present

## 2023-01-02 DIAGNOSIS — Z23 Encounter for immunization: Secondary | ICD-10-CM | POA: Diagnosis not present

## 2023-01-07 DIAGNOSIS — H43813 Vitreous degeneration, bilateral: Secondary | ICD-10-CM | POA: Diagnosis not present

## 2023-01-07 DIAGNOSIS — H353122 Nonexudative age-related macular degeneration, left eye, intermediate dry stage: Secondary | ICD-10-CM | POA: Diagnosis not present

## 2023-01-07 DIAGNOSIS — H353211 Exudative age-related macular degeneration, right eye, with active choroidal neovascularization: Secondary | ICD-10-CM | POA: Diagnosis not present

## 2023-01-11 DIAGNOSIS — H353112 Nonexudative age-related macular degeneration, right eye, intermediate dry stage: Secondary | ICD-10-CM | POA: Diagnosis not present

## 2023-02-10 DIAGNOSIS — H353112 Nonexudative age-related macular degeneration, right eye, intermediate dry stage: Secondary | ICD-10-CM | POA: Diagnosis not present

## 2023-02-18 DIAGNOSIS — I10 Essential (primary) hypertension: Secondary | ICD-10-CM | POA: Diagnosis not present

## 2023-02-18 DIAGNOSIS — R3129 Other microscopic hematuria: Secondary | ICD-10-CM | POA: Diagnosis not present

## 2023-02-18 DIAGNOSIS — E782 Mixed hyperlipidemia: Secondary | ICD-10-CM | POA: Diagnosis not present

## 2023-02-18 DIAGNOSIS — R5383 Other fatigue: Secondary | ICD-10-CM | POA: Diagnosis not present

## 2023-02-18 DIAGNOSIS — N1831 Chronic kidney disease, stage 3a: Secondary | ICD-10-CM | POA: Diagnosis not present

## 2023-02-18 DIAGNOSIS — R7303 Prediabetes: Secondary | ICD-10-CM | POA: Diagnosis not present

## 2023-02-18 DIAGNOSIS — M1A0790 Idiopathic chronic gout, unspecified ankle and foot, without tophus (tophi): Secondary | ICD-10-CM | POA: Diagnosis not present

## 2023-02-25 DIAGNOSIS — N1831 Chronic kidney disease, stage 3a: Secondary | ICD-10-CM | POA: Diagnosis not present

## 2023-02-25 DIAGNOSIS — R3129 Other microscopic hematuria: Secondary | ICD-10-CM | POA: Diagnosis not present

## 2023-02-25 DIAGNOSIS — R5383 Other fatigue: Secondary | ICD-10-CM | POA: Diagnosis not present

## 2023-02-25 DIAGNOSIS — I10 Essential (primary) hypertension: Secondary | ICD-10-CM | POA: Diagnosis not present

## 2023-02-25 DIAGNOSIS — E782 Mixed hyperlipidemia: Secondary | ICD-10-CM | POA: Diagnosis not present

## 2023-02-25 DIAGNOSIS — Z0000 Encounter for general adult medical examination without abnormal findings: Secondary | ICD-10-CM | POA: Diagnosis not present

## 2023-02-25 DIAGNOSIS — E038 Other specified hypothyroidism: Secondary | ICD-10-CM | POA: Diagnosis not present

## 2023-02-25 DIAGNOSIS — Z23 Encounter for immunization: Secondary | ICD-10-CM | POA: Diagnosis not present

## 2023-02-25 DIAGNOSIS — M1A0790 Idiopathic chronic gout, unspecified ankle and foot, without tophus (tophi): Secondary | ICD-10-CM | POA: Diagnosis not present

## 2023-03-12 DIAGNOSIS — H353112 Nonexudative age-related macular degeneration, right eye, intermediate dry stage: Secondary | ICD-10-CM | POA: Diagnosis not present

## 2023-03-19 DIAGNOSIS — C61 Malignant neoplasm of prostate: Secondary | ICD-10-CM | POA: Diagnosis not present

## 2023-03-25 DIAGNOSIS — H353122 Nonexudative age-related macular degeneration, left eye, intermediate dry stage: Secondary | ICD-10-CM | POA: Diagnosis not present

## 2023-03-25 DIAGNOSIS — R351 Nocturia: Secondary | ICD-10-CM | POA: Diagnosis not present

## 2023-03-25 DIAGNOSIS — C61 Malignant neoplasm of prostate: Secondary | ICD-10-CM | POA: Diagnosis not present

## 2023-03-25 DIAGNOSIS — N401 Enlarged prostate with lower urinary tract symptoms: Secondary | ICD-10-CM | POA: Diagnosis not present

## 2023-03-25 DIAGNOSIS — H43813 Vitreous degeneration, bilateral: Secondary | ICD-10-CM | POA: Diagnosis not present

## 2023-03-25 DIAGNOSIS — R8271 Bacteriuria: Secondary | ICD-10-CM | POA: Diagnosis not present

## 2023-03-25 DIAGNOSIS — H353211 Exudative age-related macular degeneration, right eye, with active choroidal neovascularization: Secondary | ICD-10-CM | POA: Diagnosis not present

## 2023-04-03 DIAGNOSIS — H52203 Unspecified astigmatism, bilateral: Secondary | ICD-10-CM | POA: Diagnosis not present

## 2023-04-03 DIAGNOSIS — Z961 Presence of intraocular lens: Secondary | ICD-10-CM | POA: Diagnosis not present

## 2023-04-03 DIAGNOSIS — H353211 Exudative age-related macular degeneration, right eye, with active choroidal neovascularization: Secondary | ICD-10-CM | POA: Diagnosis not present

## 2023-04-03 DIAGNOSIS — H04123 Dry eye syndrome of bilateral lacrimal glands: Secondary | ICD-10-CM | POA: Diagnosis not present

## 2023-04-11 DIAGNOSIS — H353112 Nonexudative age-related macular degeneration, right eye, intermediate dry stage: Secondary | ICD-10-CM | POA: Diagnosis not present

## 2023-05-05 DIAGNOSIS — H353211 Exudative age-related macular degeneration, right eye, with active choroidal neovascularization: Secondary | ICD-10-CM | POA: Diagnosis not present

## 2023-05-05 DIAGNOSIS — H43813 Vitreous degeneration, bilateral: Secondary | ICD-10-CM | POA: Diagnosis not present

## 2023-05-05 DIAGNOSIS — H353122 Nonexudative age-related macular degeneration, left eye, intermediate dry stage: Secondary | ICD-10-CM | POA: Diagnosis not present

## 2023-05-11 DIAGNOSIS — H353112 Nonexudative age-related macular degeneration, right eye, intermediate dry stage: Secondary | ICD-10-CM | POA: Diagnosis not present

## 2023-06-02 DIAGNOSIS — R051 Acute cough: Secondary | ICD-10-CM | POA: Diagnosis not present

## 2023-06-02 DIAGNOSIS — U071 COVID-19: Secondary | ICD-10-CM | POA: Diagnosis not present

## 2023-06-02 DIAGNOSIS — R0981 Nasal congestion: Secondary | ICD-10-CM | POA: Diagnosis not present

## 2023-06-10 DIAGNOSIS — H353112 Nonexudative age-related macular degeneration, right eye, intermediate dry stage: Secondary | ICD-10-CM | POA: Diagnosis not present

## 2023-06-12 DIAGNOSIS — H353211 Exudative age-related macular degeneration, right eye, with active choroidal neovascularization: Secondary | ICD-10-CM | POA: Diagnosis not present

## 2023-06-12 DIAGNOSIS — H353122 Nonexudative age-related macular degeneration, left eye, intermediate dry stage: Secondary | ICD-10-CM | POA: Diagnosis not present

## 2023-06-12 DIAGNOSIS — H43813 Vitreous degeneration, bilateral: Secondary | ICD-10-CM | POA: Diagnosis not present

## 2023-07-09 DIAGNOSIS — D0471 Carcinoma in situ of skin of right lower limb, including hip: Secondary | ICD-10-CM | POA: Diagnosis not present

## 2023-07-09 DIAGNOSIS — L814 Other melanin hyperpigmentation: Secondary | ICD-10-CM | POA: Diagnosis not present

## 2023-07-09 DIAGNOSIS — L538 Other specified erythematous conditions: Secondary | ICD-10-CM | POA: Diagnosis not present

## 2023-07-09 DIAGNOSIS — L821 Other seborrheic keratosis: Secondary | ICD-10-CM | POA: Diagnosis not present

## 2023-07-09 DIAGNOSIS — Z85828 Personal history of other malignant neoplasm of skin: Secondary | ICD-10-CM | POA: Diagnosis not present

## 2023-07-09 DIAGNOSIS — D492 Neoplasm of unspecified behavior of bone, soft tissue, and skin: Secondary | ICD-10-CM | POA: Diagnosis not present

## 2023-07-09 DIAGNOSIS — L570 Actinic keratosis: Secondary | ICD-10-CM | POA: Diagnosis not present

## 2023-07-09 DIAGNOSIS — L218 Other seborrheic dermatitis: Secondary | ICD-10-CM | POA: Diagnosis not present

## 2023-07-09 DIAGNOSIS — Z86007 Personal history of in-situ neoplasm of skin: Secondary | ICD-10-CM | POA: Diagnosis not present

## 2023-07-09 DIAGNOSIS — D225 Melanocytic nevi of trunk: Secondary | ICD-10-CM | POA: Diagnosis not present

## 2023-07-09 DIAGNOSIS — Z08 Encounter for follow-up examination after completed treatment for malignant neoplasm: Secondary | ICD-10-CM | POA: Diagnosis not present

## 2023-07-10 DIAGNOSIS — H353112 Nonexudative age-related macular degeneration, right eye, intermediate dry stage: Secondary | ICD-10-CM | POA: Diagnosis not present

## 2023-08-06 DIAGNOSIS — Z20822 Contact with and (suspected) exposure to covid-19: Secondary | ICD-10-CM | POA: Diagnosis not present

## 2023-08-09 DIAGNOSIS — H353122 Nonexudative age-related macular degeneration, left eye, intermediate dry stage: Secondary | ICD-10-CM | POA: Diagnosis not present

## 2023-08-24 DIAGNOSIS — H353211 Exudative age-related macular degeneration, right eye, with active choroidal neovascularization: Secondary | ICD-10-CM | POA: Diagnosis not present

## 2023-08-24 DIAGNOSIS — H353122 Nonexudative age-related macular degeneration, left eye, intermediate dry stage: Secondary | ICD-10-CM | POA: Diagnosis not present

## 2023-08-24 DIAGNOSIS — H43813 Vitreous degeneration, bilateral: Secondary | ICD-10-CM | POA: Diagnosis not present

## 2023-08-27 DIAGNOSIS — D0471 Carcinoma in situ of skin of right lower limb, including hip: Secondary | ICD-10-CM | POA: Diagnosis not present

## 2023-09-01 DIAGNOSIS — E038 Other specified hypothyroidism: Secondary | ICD-10-CM | POA: Diagnosis not present

## 2023-09-01 DIAGNOSIS — M1A0790 Idiopathic chronic gout, unspecified ankle and foot, without tophus (tophi): Secondary | ICD-10-CM | POA: Diagnosis not present

## 2023-09-01 DIAGNOSIS — E782 Mixed hyperlipidemia: Secondary | ICD-10-CM | POA: Diagnosis not present

## 2023-09-01 DIAGNOSIS — I10 Essential (primary) hypertension: Secondary | ICD-10-CM | POA: Diagnosis not present

## 2023-09-01 DIAGNOSIS — N1831 Chronic kidney disease, stage 3a: Secondary | ICD-10-CM | POA: Diagnosis not present

## 2023-09-01 DIAGNOSIS — R7303 Prediabetes: Secondary | ICD-10-CM | POA: Diagnosis not present

## 2023-09-01 DIAGNOSIS — R5383 Other fatigue: Secondary | ICD-10-CM | POA: Diagnosis not present

## 2023-09-01 DIAGNOSIS — R3129 Other microscopic hematuria: Secondary | ICD-10-CM | POA: Diagnosis not present

## 2023-09-08 DIAGNOSIS — E038 Other specified hypothyroidism: Secondary | ICD-10-CM | POA: Diagnosis not present

## 2023-09-08 DIAGNOSIS — H353122 Nonexudative age-related macular degeneration, left eye, intermediate dry stage: Secondary | ICD-10-CM | POA: Diagnosis not present

## 2023-09-08 DIAGNOSIS — I10 Essential (primary) hypertension: Secondary | ICD-10-CM | POA: Diagnosis not present

## 2023-09-08 DIAGNOSIS — R5383 Other fatigue: Secondary | ICD-10-CM | POA: Diagnosis not present

## 2023-09-08 DIAGNOSIS — M1A0790 Idiopathic chronic gout, unspecified ankle and foot, without tophus (tophi): Secondary | ICD-10-CM | POA: Diagnosis not present

## 2023-09-08 DIAGNOSIS — E782 Mixed hyperlipidemia: Secondary | ICD-10-CM | POA: Diagnosis not present

## 2023-09-08 DIAGNOSIS — R3129 Other microscopic hematuria: Secondary | ICD-10-CM | POA: Diagnosis not present

## 2023-09-08 DIAGNOSIS — R7303 Prediabetes: Secondary | ICD-10-CM | POA: Diagnosis not present

## 2023-09-08 DIAGNOSIS — N1831 Chronic kidney disease, stage 3a: Secondary | ICD-10-CM | POA: Diagnosis not present

## 2023-09-28 DIAGNOSIS — C61 Malignant neoplasm of prostate: Secondary | ICD-10-CM | POA: Diagnosis not present

## 2023-10-05 DIAGNOSIS — N401 Enlarged prostate with lower urinary tract symptoms: Secondary | ICD-10-CM | POA: Diagnosis not present

## 2023-10-05 DIAGNOSIS — N3941 Urge incontinence: Secondary | ICD-10-CM | POA: Diagnosis not present

## 2023-10-05 DIAGNOSIS — C61 Malignant neoplasm of prostate: Secondary | ICD-10-CM | POA: Diagnosis not present

## 2023-10-05 DIAGNOSIS — R351 Nocturia: Secondary | ICD-10-CM | POA: Diagnosis not present

## 2023-10-08 DIAGNOSIS — H353122 Nonexudative age-related macular degeneration, left eye, intermediate dry stage: Secondary | ICD-10-CM | POA: Diagnosis not present

## 2023-11-02 DIAGNOSIS — H353122 Nonexudative age-related macular degeneration, left eye, intermediate dry stage: Secondary | ICD-10-CM | POA: Diagnosis not present

## 2023-11-02 DIAGNOSIS — H353211 Exudative age-related macular degeneration, right eye, with active choroidal neovascularization: Secondary | ICD-10-CM | POA: Diagnosis not present

## 2023-11-02 DIAGNOSIS — H43813 Vitreous degeneration, bilateral: Secondary | ICD-10-CM | POA: Diagnosis not present

## 2023-11-02 DIAGNOSIS — H35372 Puckering of macula, left eye: Secondary | ICD-10-CM | POA: Diagnosis not present

## 2023-11-04 DIAGNOSIS — H43391 Other vitreous opacities, right eye: Secondary | ICD-10-CM | POA: Diagnosis not present

## 2023-11-04 DIAGNOSIS — H43811 Vitreous degeneration, right eye: Secondary | ICD-10-CM | POA: Diagnosis not present

## 2023-11-04 DIAGNOSIS — H44001 Unspecified purulent endophthalmitis, right eye: Secondary | ICD-10-CM | POA: Diagnosis not present

## 2023-11-05 DIAGNOSIS — H44001 Unspecified purulent endophthalmitis, right eye: Secondary | ICD-10-CM | POA: Diagnosis not present

## 2023-11-05 DIAGNOSIS — H43391 Other vitreous opacities, right eye: Secondary | ICD-10-CM | POA: Diagnosis not present

## 2023-11-05 DIAGNOSIS — H43811 Vitreous degeneration, right eye: Secondary | ICD-10-CM | POA: Diagnosis not present

## 2023-11-07 DIAGNOSIS — H353122 Nonexudative age-related macular degeneration, left eye, intermediate dry stage: Secondary | ICD-10-CM | POA: Diagnosis not present

## 2023-11-09 DIAGNOSIS — H44001 Unspecified purulent endophthalmitis, right eye: Secondary | ICD-10-CM | POA: Diagnosis not present

## 2023-11-09 DIAGNOSIS — H43391 Other vitreous opacities, right eye: Secondary | ICD-10-CM | POA: Diagnosis not present

## 2023-11-09 DIAGNOSIS — H43811 Vitreous degeneration, right eye: Secondary | ICD-10-CM | POA: Diagnosis not present

## 2023-11-11 DIAGNOSIS — H44001 Unspecified purulent endophthalmitis, right eye: Secondary | ICD-10-CM | POA: Diagnosis not present

## 2023-11-11 DIAGNOSIS — H43391 Other vitreous opacities, right eye: Secondary | ICD-10-CM | POA: Diagnosis not present

## 2023-11-13 DIAGNOSIS — H44001 Unspecified purulent endophthalmitis, right eye: Secondary | ICD-10-CM | POA: Diagnosis not present

## 2023-11-13 DIAGNOSIS — H43391 Other vitreous opacities, right eye: Secondary | ICD-10-CM | POA: Diagnosis not present

## 2023-11-19 DIAGNOSIS — H44001 Unspecified purulent endophthalmitis, right eye: Secondary | ICD-10-CM | POA: Diagnosis not present

## 2023-11-19 DIAGNOSIS — H43391 Other vitreous opacities, right eye: Secondary | ICD-10-CM | POA: Diagnosis not present

## 2023-11-19 DIAGNOSIS — H44011 Panophthalmitis (acute), right eye: Secondary | ICD-10-CM | POA: Diagnosis not present

## 2023-12-04 DIAGNOSIS — Z23 Encounter for immunization: Secondary | ICD-10-CM | POA: Diagnosis not present

## 2023-12-07 DIAGNOSIS — H353122 Nonexudative age-related macular degeneration, left eye, intermediate dry stage: Secondary | ICD-10-CM | POA: Diagnosis not present

## 2023-12-25 DIAGNOSIS — H353211 Exudative age-related macular degeneration, right eye, with active choroidal neovascularization: Secondary | ICD-10-CM | POA: Diagnosis not present

## 2023-12-25 DIAGNOSIS — H44001 Unspecified purulent endophthalmitis, right eye: Secondary | ICD-10-CM | POA: Diagnosis not present

## 2023-12-25 DIAGNOSIS — Z9889 Other specified postprocedural states: Secondary | ICD-10-CM | POA: Diagnosis not present

## 2024-01-06 DIAGNOSIS — H353122 Nonexudative age-related macular degeneration, left eye, intermediate dry stage: Secondary | ICD-10-CM | POA: Diagnosis not present

## 2024-01-11 DIAGNOSIS — D044 Carcinoma in situ of skin of scalp and neck: Secondary | ICD-10-CM | POA: Diagnosis not present

## 2024-01-11 DIAGNOSIS — L821 Other seborrheic keratosis: Secondary | ICD-10-CM | POA: Diagnosis not present

## 2024-01-11 DIAGNOSIS — R208 Other disturbances of skin sensation: Secondary | ICD-10-CM | POA: Diagnosis not present

## 2024-01-11 DIAGNOSIS — D492 Neoplasm of unspecified behavior of bone, soft tissue, and skin: Secondary | ICD-10-CM | POA: Diagnosis not present

## 2024-01-11 DIAGNOSIS — L814 Other melanin hyperpigmentation: Secondary | ICD-10-CM | POA: Diagnosis not present

## 2024-01-11 DIAGNOSIS — L538 Other specified erythematous conditions: Secondary | ICD-10-CM | POA: Diagnosis not present

## 2024-01-11 DIAGNOSIS — D225 Melanocytic nevi of trunk: Secondary | ICD-10-CM | POA: Diagnosis not present

## 2024-01-11 DIAGNOSIS — Z08 Encounter for follow-up examination after completed treatment for malignant neoplasm: Secondary | ICD-10-CM | POA: Diagnosis not present

## 2024-01-11 DIAGNOSIS — Z86007 Personal history of in-situ neoplasm of skin: Secondary | ICD-10-CM | POA: Diagnosis not present

## 2024-01-11 DIAGNOSIS — L820 Inflamed seborrheic keratosis: Secondary | ICD-10-CM | POA: Diagnosis not present

## 2024-01-11 DIAGNOSIS — L570 Actinic keratosis: Secondary | ICD-10-CM | POA: Diagnosis not present

## 2024-01-22 DIAGNOSIS — H44001 Unspecified purulent endophthalmitis, right eye: Secondary | ICD-10-CM | POA: Diagnosis not present

## 2024-01-22 DIAGNOSIS — H353211 Exudative age-related macular degeneration, right eye, with active choroidal neovascularization: Secondary | ICD-10-CM | POA: Diagnosis not present

## 2024-01-22 DIAGNOSIS — Z9889 Other specified postprocedural states: Secondary | ICD-10-CM | POA: Diagnosis not present

## 2024-02-05 DIAGNOSIS — H353122 Nonexudative age-related macular degeneration, left eye, intermediate dry stage: Secondary | ICD-10-CM | POA: Diagnosis not present
